# Patient Record
Sex: Female | Born: 1970
Health system: Southern US, Community
[De-identification: ages and names within clinical notes are randomized; demographics above are authoritative.]

## PROBLEM LIST (undated history)

## (undated) ENCOUNTER — Ambulatory Visit

## (undated) DIAGNOSIS — I10 Essential (primary) hypertension: Secondary | ICD-10-CM

## (undated) DIAGNOSIS — L509 Urticaria, unspecified: Secondary | ICD-10-CM

## (undated) DIAGNOSIS — H4312 Vitreous hemorrhage, left eye: Secondary | ICD-10-CM

## (undated) DIAGNOSIS — R51 Headache: Secondary | ICD-10-CM

## (undated) HISTORY — DX: Urticaria, unspecified: L50.9

## (undated) HISTORY — PX: WISDOM TOOTH EXTRACTION: SHX21

---

## 1999-04-30 ENCOUNTER — Emergency Department (HOSPITAL_COMMUNITY): Admission: EM | Admit: 1999-04-30 | Discharge: 1999-04-30 | Payer: Self-pay | Admitting: Internal Medicine

## 2001-06-27 ENCOUNTER — Other Ambulatory Visit: Admission: RE | Admit: 2001-06-27 | Discharge: 2001-06-27 | Payer: Self-pay | Admitting: Obstetrics and Gynecology

## 2008-03-30 ENCOUNTER — Emergency Department (HOSPITAL_COMMUNITY): Admission: EM | Admit: 2008-03-30 | Discharge: 2008-03-31 | Payer: Self-pay | Admitting: Emergency Medicine

## 2010-04-18 ENCOUNTER — Ambulatory Visit: Payer: Self-pay | Admitting: Internal Medicine

## 2010-04-18 ENCOUNTER — Encounter (INDEPENDENT_AMBULATORY_CARE_PROVIDER_SITE_OTHER): Payer: Self-pay | Admitting: Family Medicine

## 2010-04-18 LAB — CONVERTED CEMR LAB
ALT: 9 units/L (ref 0–35)
AST: 18 units/L (ref 0–37)
Albumin: 4.6 g/dL (ref 3.5–5.2)
BUN: 20 mg/dL (ref 6–23)
Basophils Relative: 0 % (ref 0–1)
CO2: 25 meq/L (ref 19–32)
Calcium: 9.7 mg/dL (ref 8.4–10.5)
Chloride: 104 meq/L (ref 96–112)
HDL: 78 mg/dL (ref >39)
Lymphocytes Relative: 24 % (ref 12–46)
MCHC: 31.7 g/dL (ref 30.0–36.0)
Monocytes Relative: 6 % (ref 3–12)
Neutro Abs: 3.2 10*3/uL (ref 1.7–7.7)
Neutrophils Relative %: 68 % (ref 43–77)
Potassium: 3.7 meq/L (ref 3.5–5.3)
RBC: 4.69 M/uL (ref 3.87–5.11)
WBC: 4.7 10*3/uL (ref 4.0–10.5)

## 2010-04-25 ENCOUNTER — Ambulatory Visit (HOSPITAL_COMMUNITY): Admission: RE | Admit: 2010-04-25 | Discharge: 2010-04-25 | Payer: Self-pay | Admitting: Family Medicine

## 2011-07-02 ENCOUNTER — Other Ambulatory Visit (HOSPITAL_COMMUNITY)
Admission: RE | Admit: 2011-07-02 | Discharge: 2011-07-02 | Disposition: A | Discharge status: Home / Self Care | Payer: BC Managed Care – PPO | Source: Ambulatory Visit | Attending: Obstetrics and Gynecology | Admitting: Obstetrics and Gynecology

## 2011-07-02 ENCOUNTER — Other Ambulatory Visit: Payer: Self-pay | Admitting: Obstetrics and Gynecology

## 2011-07-02 DIAGNOSIS — Z01419 Encounter for gynecological examination (general) (routine) without abnormal findings: Secondary | ICD-10-CM | POA: Insufficient documentation

## 2011-07-02 DIAGNOSIS — N760 Acute vaginitis: Secondary | ICD-10-CM | POA: Insufficient documentation

## 2011-07-02 DIAGNOSIS — Z113 Encounter for screening for infections with a predominantly sexual mode of transmission: Secondary | ICD-10-CM | POA: Insufficient documentation

## 2013-12-08 ENCOUNTER — Encounter (INDEPENDENT_AMBULATORY_CARE_PROVIDER_SITE_OTHER): Payer: Federal, State, Local not specified - PPO | Admitting: Ophthalmology

## 2013-12-08 DIAGNOSIS — H4310 Vitreous hemorrhage, unspecified eye: Secondary | ICD-10-CM

## 2013-12-08 DIAGNOSIS — H43819 Vitreous degeneration, unspecified eye: Secondary | ICD-10-CM

## 2013-12-13 ENCOUNTER — Encounter (INDEPENDENT_AMBULATORY_CARE_PROVIDER_SITE_OTHER): Payer: Federal, State, Local not specified - PPO | Admitting: Ophthalmology

## 2013-12-13 DIAGNOSIS — H43819 Vitreous degeneration, unspecified eye: Secondary | ICD-10-CM

## 2013-12-13 DIAGNOSIS — H2510 Age-related nuclear cataract, unspecified eye: Secondary | ICD-10-CM

## 2013-12-13 DIAGNOSIS — H4310 Vitreous hemorrhage, unspecified eye: Secondary | ICD-10-CM

## 2013-12-14 ENCOUNTER — Encounter (HOSPITAL_COMMUNITY): Payer: Self-pay | Admitting: Pharmacy Technician

## 2013-12-14 DIAGNOSIS — H4310 Vitreous hemorrhage, unspecified eye: Secondary | ICD-10-CM | POA: Diagnosis present

## 2013-12-14 NOTE — H&P (Signed)
Kristen Rosario is an 43 y.o. female.   Chief Complaint:loss of vision left eye after vomiting HPI: Vitreous hemorrhage left eye after violent vomiting   No past medical history on file.  No past surgical history on file.  No family history on file. Social History:  has no tobacco, alcohol, and drug history on file.  Allergies:  Allergies  Allergen Reactions  . Sulfur Itching    No prescriptions prior to admission    Review of systems otherwise negative  There were no vitals taken for this visit.  Physical exam: Mental status: oriented x3. Eyes: See eye exam associated with this date of surgery in media tab.  Scanned in by scanning center Ears, Nose, Throat: within normal limits Neck: Within Normal limits General: within normal limits Chest: Within normal limits Breast: deferred Heart: Within normal limits Abdomen: Within normal limits GU: deferred Extremities: within normal limits Skin: within normal limits  Assessment/Plan Vitreous hemorrhage  [facility] Pars plana vitrectomy, laser treatment, possible gas injection, membrane peel  Left eye  Sherrie GeorgeJohn D Raymonde Hamblin 12/14/2013, 12:44 PM

## 2013-12-18 ENCOUNTER — Encounter (HOSPITAL_COMMUNITY): Payer: Self-pay | Admitting: *Deleted

## 2013-12-18 MED ORDER — PHENYLEPHRINE HCL 2.5 % OP SOLN
1.0000 [drp] | OPHTHALMIC | Status: DC | PRN
  Filled 2013-12-18: qty 15

## 2013-12-18 MED ORDER — GATIFLOXACIN 0.5 % OP SOLN
1.0000 [drp] | OPHTHALMIC | Status: DC | PRN
  Filled 2013-12-18: qty 2.5

## 2013-12-18 MED ORDER — TROPICAMIDE 1 % OP SOLN
1.0000 [drp] | OPHTHALMIC | Status: DC | PRN
  Filled 2013-12-18: qty 3

## 2013-12-18 MED ORDER — CYCLOPENTOLATE HCL 1 % OP SOLN
1.0000 [drp] | OPHTHALMIC | Status: DC | PRN
  Filled 2013-12-18: qty 2

## 2013-12-19 ENCOUNTER — Ambulatory Visit (HOSPITAL_COMMUNITY): Payer: Federal, State, Local not specified - PPO | Admitting: Anesthesiology

## 2013-12-19 ENCOUNTER — Encounter (HOSPITAL_COMMUNITY): Payer: Self-pay | Admitting: *Deleted

## 2013-12-19 ENCOUNTER — Ambulatory Visit (HOSPITAL_COMMUNITY): Payer: Federal, State, Local not specified - PPO

## 2013-12-19 ENCOUNTER — Ambulatory Visit (HOSPITAL_COMMUNITY)
Admission: RE | Admit: 2013-12-19 | Discharge: 2013-12-20 | Disposition: A | Discharge status: Home / Self Care | Payer: Federal, State, Local not specified - PPO | Source: Ambulatory Visit | Attending: Ophthalmology | Admitting: Ophthalmology

## 2013-12-19 ENCOUNTER — Encounter (HOSPITAL_COMMUNITY): Payer: Federal, State, Local not specified - PPO | Admitting: Anesthesiology

## 2013-12-19 ENCOUNTER — Encounter (HOSPITAL_COMMUNITY)
Admission: RE | Disposition: A | Discharge status: Home / Self Care | Payer: Self-pay | Source: Ambulatory Visit | Attending: Ophthalmology

## 2013-12-19 DIAGNOSIS — H4310 Vitreous hemorrhage, unspecified eye: Secondary | ICD-10-CM

## 2013-12-19 DIAGNOSIS — H43319 Vitreous membranes and strands, unspecified eye: Secondary | ICD-10-CM | POA: Insufficient documentation

## 2013-12-19 DIAGNOSIS — H4312 Vitreous hemorrhage, left eye: Secondary | ICD-10-CM

## 2013-12-19 HISTORY — PX: PARS PLANA VITRECTOMY: SHX2166

## 2013-12-19 HISTORY — DX: Essential (primary) hypertension: I10

## 2013-12-19 HISTORY — DX: Vitreous hemorrhage, left eye: H43.12

## 2013-12-19 HISTORY — PX: MEMBRANE PEEL: SHX5967

## 2013-12-19 HISTORY — DX: Headache: R51

## 2013-12-19 LAB — CBC
HCT: 36.4 % (ref 36.0–46.0)
HEMOGLOBIN: 11.7 g/dL — AB (ref 12.0–15.0)
MCH: 24.4 pg — AB (ref 26.0–34.0)
MCHC: 32.1 g/dL (ref 30.0–36.0)
MCV: 76 fL — ABNORMAL LOW (ref 78.0–100.0)
PLATELETS: 254 10*3/uL (ref 150–400)
RBC: 4.79 MIL/uL (ref 3.87–5.11)
RDW: 15.6 % — AB (ref 11.5–15.5)
WBC: 3.9 10*3/uL — ABNORMAL LOW (ref 4.0–10.5)

## 2013-12-19 LAB — HCG, SERUM, QUALITATIVE: PREG SERUM: NEGATIVE

## 2013-12-19 SURGERY — PARS PLANA VITRECTOMY WITH 25 GAUGE
Anesthesia: General | Site: Eye | Laterality: Left

## 2013-12-19 MED ORDER — LATANOPROST 0.005 % OP SOLN
1.0000 [drp] | Freq: Every day | OPHTHALMIC | Status: DC
  Filled 2013-12-19: qty 2.5

## 2013-12-19 MED ORDER — HEMOSTATIC AGENTS (NO CHARGE) OPTIME
TOPICAL | Status: DC | PRN
  Administered 2013-12-19: 1 via TOPICAL

## 2013-12-19 MED ORDER — STERILE WATER FOR IRRIGATION IR SOLN
Status: DC | PRN
  Administered 2013-12-19: 200 mL

## 2013-12-19 MED ORDER — GLYCOPYRROLATE 0.2 MG/ML IJ SOLN
INTRAMUSCULAR | Status: DC | PRN
  Administered 2013-12-19: 0.4 mg via INTRAVENOUS

## 2013-12-19 MED ORDER — DEXAMETHASONE SODIUM PHOSPHATE 10 MG/ML IJ SOLN
INTRAMUSCULAR | Status: DC | PRN
  Administered 2013-12-19: 10 mg via INTRAVENOUS

## 2013-12-19 MED ORDER — BACITRACIN-POLYMYXIN B 500-10000 UNIT/GM OP OINT
TOPICAL_OINTMENT | OPHTHALMIC | Status: DC | PRN
  Administered 2013-12-19: 1 via OPHTHALMIC

## 2013-12-19 MED ORDER — LIDOCAINE HCL (CARDIAC) 20 MG/ML IV SOLN
INTRAVENOUS | Status: AC
  Filled 2013-12-19: qty 10

## 2013-12-19 MED ORDER — 0.9 % SODIUM CHLORIDE (POUR BTL) OPTIME
TOPICAL | Status: DC | PRN
  Administered 2013-12-19: 200 mL

## 2013-12-19 MED ORDER — FENTANYL CITRATE 0.05 MG/ML IJ SOLN
25.0000 ug | INTRAMUSCULAR | Status: DC | PRN
  Administered 2013-12-19 (×2): 50 ug via INTRAVENOUS

## 2013-12-19 MED ORDER — CEFAZOLIN SODIUM-DEXTROSE 2-3 GM-% IV SOLR
INTRAVENOUS | Status: DC | PRN
  Administered 2013-12-19: 2 g via INTRAVENOUS

## 2013-12-19 MED ORDER — SODIUM CHLORIDE 0.9 % IV SOLN
INTRAVENOUS | Status: DC
  Administered 2013-12-19: 10:00:00 via INTRAVENOUS

## 2013-12-19 MED ORDER — MIDAZOLAM HCL 2 MG/2ML IJ SOLN
INTRAMUSCULAR | Status: AC
  Filled 2013-12-19: qty 2

## 2013-12-19 MED ORDER — BSS PLUS IO SOLN
INTRAOCULAR | Status: AC
Start: 2013-12-19 — End: 2013-12-19
  Filled 2013-12-19: qty 500

## 2013-12-19 MED ORDER — SODIUM CHLORIDE 0.9 % IJ SOLN
INTRAMUSCULAR | Status: AC
  Filled 2013-12-19: qty 10

## 2013-12-19 MED ORDER — TEMAZEPAM 15 MG PO CAPS: 15.0000 mg | ORAL_CAPSULE | Freq: Every evening | ORAL | Status: DC | PRN

## 2013-12-19 MED ORDER — CYCLOPENTOLATE HCL 1 % OP SOLN
1.0000 [drp] | OPHTHALMIC | Status: AC
Start: 2013-12-19 — End: 2013-12-19
  Administered 2013-12-19 (×3): 1 [drp] via OPHTHALMIC
  Filled 2013-12-19: qty 2

## 2013-12-19 MED ORDER — DEXAMETHASONE SODIUM PHOSPHATE 10 MG/ML IJ SOLN
INTRAMUSCULAR | Status: AC
  Filled 2013-12-19: qty 1

## 2013-12-19 MED ORDER — ONDANSETRON HCL 4 MG/2ML IJ SOLN: 4.0000 mg | Freq: Four times a day (QID) | INTRAMUSCULAR | Status: DC | PRN

## 2013-12-19 MED ORDER — BSS PLUS IO SOLN
INTRAOCULAR | Status: DC | PRN
  Administered 2013-12-19: 13:00:00

## 2013-12-19 MED ORDER — PROPOFOL 10 MG/ML IV BOLUS
INTRAVENOUS | Status: DC | PRN
  Administered 2013-12-19: 150 mg via INTRAVENOUS

## 2013-12-19 MED ORDER — HYDROCODONE-ACETAMINOPHEN 5-325 MG PO TABS
1.0000 | ORAL_TABLET | ORAL | Status: DC | PRN
Start: 2013-12-19 — End: 2013-12-20
  Administered 2013-12-19 – 2013-12-20 (×2): 2 via ORAL
  Filled 2013-12-19 (×2): qty 2

## 2013-12-19 MED ORDER — ONDANSETRON HCL 4 MG/2ML IJ SOLN
INTRAMUSCULAR | Status: DC | PRN
  Administered 2013-12-19: 4 mg via INTRAVENOUS

## 2013-12-19 MED ORDER — OXYCODONE HCL 5 MG PO TABS: 5.0000 mg | ORAL_TABLET | Freq: Once | ORAL | Status: DC | PRN

## 2013-12-19 MED ORDER — METOCLOPRAMIDE HCL 5 MG/ML IJ SOLN: 10.0000 mg | Freq: Once | INTRAMUSCULAR | Status: DC | PRN

## 2013-12-19 MED ORDER — BACITRACIN-POLYMYXIN B 500-10000 UNIT/GM OP OINT
TOPICAL_OINTMENT | OPHTHALMIC | Status: AC
  Filled 2013-12-19: qty 3.5

## 2013-12-19 MED ORDER — SODIUM CHLORIDE 0.45 % IV SOLN
INTRAVENOUS | Status: DC
  Administered 2013-12-19: 16:00:00 via INTRAVENOUS

## 2013-12-19 MED ORDER — FENTANYL CITRATE 0.05 MG/ML IJ SOLN
INTRAMUSCULAR | Status: AC
  Filled 2013-12-19: qty 5

## 2013-12-19 MED ORDER — PREDNISOLONE ACETATE 1 % OP SUSP
1.0000 [drp] | Freq: Four times a day (QID) | OPHTHALMIC | Status: DC
  Filled 2013-12-19: qty 5
  Filled 2013-12-19: qty 1

## 2013-12-19 MED ORDER — CEFAZOLIN SODIUM-DEXTROSE 2-3 GM-% IV SOLR
INTRAVENOUS | Status: AC
  Filled 2013-12-19: qty 50

## 2013-12-19 MED ORDER — PHENYLEPHRINE HCL 2.5 % OP SOLN
1.0000 [drp] | OPHTHALMIC | Status: AC
  Administered 2013-12-19 (×3): 1 [drp] via OPHTHALMIC
  Filled 2013-12-19: qty 2

## 2013-12-19 MED ORDER — GATIFLOXACIN 0.5 % OP SOLN
1.0000 [drp] | Freq: Four times a day (QID) | OPHTHALMIC | Status: DC
  Filled 2013-12-19: qty 2.5

## 2013-12-19 MED ORDER — LIDOCAINE HCL 4 % MT SOLN
OROMUCOSAL | Status: DC | PRN
  Administered 2013-12-19: 4 mL via TOPICAL

## 2013-12-19 MED ORDER — LIDOCAINE HCL (CARDIAC) 20 MG/ML IV SOLN
INTRAVENOUS | Status: DC | PRN
Start: 2013-12-19 — End: 2013-12-19
  Administered 2013-12-19: 40 mg via INTRAVENOUS

## 2013-12-19 MED ORDER — TROPICAMIDE 1 % OP SOLN
1.0000 [drp] | OPHTHALMIC | Status: AC
  Administered 2013-12-19 (×3): 1 [drp] via OPHTHALMIC
  Filled 2013-12-19: qty 2

## 2013-12-19 MED ORDER — FENTANYL CITRATE 0.05 MG/ML IJ SOLN
INTRAMUSCULAR | Status: AC
  Filled 2013-12-19: qty 2

## 2013-12-19 MED ORDER — SODIUM HYALURONATE 10 MG/ML IO SOLN
INTRAOCULAR | Status: AC
  Filled 2013-12-19: qty 0.85

## 2013-12-19 MED ORDER — BRIMONIDINE TARTRATE 0.2 % OP SOLN
1.0000 [drp] | Freq: Two times a day (BID) | OPHTHALMIC | Status: DC
  Filled 2013-12-19: qty 5

## 2013-12-19 MED ORDER — OXYCODONE HCL 5 MG/5ML PO SOLN: 5.0000 mg | Freq: Once | ORAL | Status: DC | PRN

## 2013-12-19 MED ORDER — ROCURONIUM BROMIDE 100 MG/10ML IV SOLN
INTRAVENOUS | Status: DC | PRN
  Administered 2013-12-19: 35 mg via INTRAVENOUS

## 2013-12-19 MED ORDER — MORPHINE SULFATE 2 MG/ML IJ SOLN
1.0000 mg | INTRAMUSCULAR | Status: AC | PRN
  Administered 2013-12-19 (×2): 2 mg via INTRAVENOUS
  Filled 2013-12-19 (×2): qty 1

## 2013-12-19 MED ORDER — BACITRACIN-POLYMYXIN B 500-10000 UNIT/GM OP OINT
1.0000 "application " | TOPICAL_OINTMENT | Freq: Four times a day (QID) | OPHTHALMIC | Status: DC
  Filled 2013-12-19: qty 3.5

## 2013-12-19 MED ORDER — NEOSTIGMINE METHYLSULFATE 1 MG/ML IJ SOLN
INTRAMUSCULAR | Status: DC | PRN
  Administered 2013-12-19: 3 mg via INTRAVENOUS

## 2013-12-19 MED ORDER — PROPOFOL 10 MG/ML IV BOLUS
INTRAVENOUS | Status: AC
  Filled 2013-12-19: qty 20

## 2013-12-19 MED ORDER — GENTAMICIN SULFATE 40 MG/ML IJ SOLN
INTRAMUSCULAR | Status: AC
  Filled 2013-12-19: qty 2

## 2013-12-19 MED ORDER — ATROPINE SULFATE 1 % OP SOLN
OPHTHALMIC | Status: AC
  Filled 2013-12-19: qty 2

## 2013-12-19 MED ORDER — SODIUM HYALURONATE 10 MG/ML IO SOLN
INTRAOCULAR | Status: DC | PRN
  Administered 2013-12-19: 0.85 mL via INTRAOCULAR

## 2013-12-19 MED ORDER — POLYMYXIN B SULFATE 500000 UNITS IJ SOLR
INTRAMUSCULAR | Status: AC
  Filled 2013-12-19: qty 1

## 2013-12-19 MED ORDER — ROCURONIUM BROMIDE 50 MG/5ML IV SOLN
INTRAVENOUS | Status: AC
  Filled 2013-12-19: qty 1

## 2013-12-19 MED ORDER — SODIUM CHLORIDE 0.9 % IJ SOLN
INTRAMUSCULAR | Status: DC | PRN
  Administered 2013-12-19: 14:00:00

## 2013-12-19 MED ORDER — DOCUSATE SODIUM 100 MG PO CAPS
100.0000 mg | ORAL_CAPSULE | Freq: Two times a day (BID) | ORAL | Status: DC
  Administered 2013-12-19: 100 mg via ORAL
  Filled 2013-12-19: qty 1

## 2013-12-19 MED ORDER — ACETAZOLAMIDE SODIUM 500 MG IJ SOLR
500.0000 mg | Freq: Once | INTRAMUSCULAR | Status: AC
  Administered 2013-12-20: 500 mg via INTRAVENOUS
  Filled 2013-12-19: qty 500

## 2013-12-19 MED ORDER — MAGNESIUM HYDROXIDE 400 MG/5ML PO SUSP: 15.0000 mL | Freq: Four times a day (QID) | ORAL | Status: DC | PRN

## 2013-12-19 MED ORDER — DEXAMETHASONE SODIUM PHOSPHATE 4 MG/ML IJ SOLN
INTRAMUSCULAR | Status: DC | PRN
  Administered 2013-12-19: 8 mg via INTRAVENOUS

## 2013-12-19 MED ORDER — EPINEPHRINE HCL 1 MG/ML IJ SOLN
INTRAMUSCULAR | Status: AC
  Filled 2013-12-19: qty 1

## 2013-12-19 MED ORDER — FENTANYL CITRATE 0.05 MG/ML IJ SOLN
INTRAMUSCULAR | Status: DC | PRN
  Administered 2013-12-19 (×3): 50 ug via INTRAVENOUS

## 2013-12-19 MED ORDER — BUPIVACAINE HCL (PF) 0.75 % IJ SOLN
INTRAMUSCULAR | Status: DC | PRN
  Administered 2013-12-19: 10 mL

## 2013-12-19 MED ORDER — MIDAZOLAM HCL 5 MG/5ML IJ SOLN
INTRAMUSCULAR | Status: DC | PRN
  Administered 2013-12-19: 2 mg via INTRAVENOUS

## 2013-12-19 MED ORDER — ONDANSETRON HCL 4 MG/2ML IJ SOLN
INTRAMUSCULAR | Status: AC
  Filled 2013-12-19: qty 2

## 2013-12-19 MED ORDER — TETRACAINE HCL 0.5 % OP SOLN
2.0000 [drp] | Freq: Once | OPHTHALMIC | Status: DC
  Filled 2013-12-19: qty 2

## 2013-12-19 MED ORDER — GATIFLOXACIN 0.5 % OP SOLN
1.0000 [drp] | OPHTHALMIC | Status: AC
  Administered 2013-12-19 (×3): 1 [drp] via OPHTHALMIC
  Filled 2013-12-19: qty 2.5

## 2013-12-19 MED ORDER — BUPIVACAINE HCL (PF) 0.75 % IJ SOLN
INTRAMUSCULAR | Status: AC
  Filled 2013-12-19: qty 10

## 2013-12-19 MED ORDER — ACETAMINOPHEN 325 MG PO TABS: 325.0000 mg | ORAL_TABLET | ORAL | Status: DC | PRN

## 2013-12-19 SURGICAL SUPPLY — 63 items
APPLICATOR DR MATTHEWS STRL (MISCELLANEOUS) ×3 IMPLANT
BLADE 10 SAFETY STRL DISP (BLADE) IMPLANT
BLADE EYE CATARACT 19 1.4 BEAV (BLADE) IMPLANT
BLADE MVR KNIFE 19G (BLADE) IMPLANT
BLADE MVR KNIFE 20G (BLADE) IMPLANT
CANNULA DUAL BORE 23G (CANNULA) IMPLANT
CANNULA VLV SOFT TIP 25GA (OPHTHALMIC) ×3 IMPLANT
CORDS BIPOLAR (ELECTRODE) IMPLANT
COTTONBALL LRG STERILE PKG (GAUZE/BANDAGES/DRESSINGS) ×9 IMPLANT
COVER MAYO STAND STRL (DRAPES) ×3 IMPLANT
DRAPE INCISE 51X51 W/FILM STRL (DRAPES) ×3 IMPLANT
DRAPE OPHTHALMIC 77X100 STRL (CUSTOM PROCEDURE TRAY) ×3 IMPLANT
FILTER BLUE MILLIPORE (MISCELLANEOUS) IMPLANT
FILTER STRAW FLUID ASPIR (MISCELLANEOUS) ×3 IMPLANT
FORCEPS ECKARDT ILM 25G SERR (OPHTHALMIC RELATED) IMPLANT
FORCEPS GRIESHABER ILM 25G A (INSTRUMENTS) IMPLANT
GLOVE SS BIOGEL STRL SZ 6.5 (GLOVE) ×1 IMPLANT
GLOVE SS BIOGEL STRL SZ 7 (GLOVE) ×1 IMPLANT
GLOVE SUPERSENSE BIOGEL SZ 6.5 (GLOVE) ×2
GLOVE SUPERSENSE BIOGEL SZ 7 (GLOVE) ×2
GLOVE SURG 8.5 LATEX PF (GLOVE) ×3 IMPLANT
GLOVE SURG SS PI 6.5 STRL IVOR (GLOVE) ×3 IMPLANT
GOWN STRL REUS W/ TWL LRG LVL3 (GOWN DISPOSABLE) ×3 IMPLANT
GOWN STRL REUS W/TWL LRG LVL3 (GOWN DISPOSABLE) ×6
HANDLE PNEUMATIC FOR CONSTEL (OPHTHALMIC) IMPLANT
KIT BASIN OR (CUSTOM PROCEDURE TRAY) ×3 IMPLANT
KNIFE CRESCENT 2.5 55 ANG (BLADE) IMPLANT
MICROPICK 25G (MISCELLANEOUS)
NEEDLE 18GX1X1/2 (RX/OR ONLY) (NEEDLE) ×3 IMPLANT
NEEDLE 25GX 5/8IN NON SAFETY (NEEDLE) ×3 IMPLANT
NEEDLE 27GAX1X1/2 (NEEDLE) IMPLANT
NEEDLE FILTER BLUNT 18X 1/2SAF (NEEDLE) ×2
NEEDLE FILTER BLUNT 18X1 1/2 (NEEDLE) ×1 IMPLANT
NEEDLE HYPO 30X.5 LL (NEEDLE) ×3 IMPLANT
NS IRRIG 1000ML POUR BTL (IV SOLUTION) ×3 IMPLANT
PACK VITRECTOMY CUSTOM (CUSTOM PROCEDURE TRAY) ×3 IMPLANT
PAD ARMBOARD 7.5X6 YLW CONV (MISCELLANEOUS) ×6 IMPLANT
PAK PIK VITRECTOMY CVS 25GA (OPHTHALMIC) ×3 IMPLANT
PENCIL BIPOLAR 25GA STR DISP (OPHTHALMIC RELATED) IMPLANT
PIC ILLUMINATED 25G (OPHTHALMIC) ×3
PICK MICROPICK 25G (MISCELLANEOUS) IMPLANT
PIK ILLUMINATED 25G (OPHTHALMIC) ×1 IMPLANT
PROBE LASER ILLUM FLEX CVD 25G (OPHTHALMIC) IMPLANT
REPL STRA BRUSH NEEDLE (NEEDLE) IMPLANT
RESERVOIR BACK FLUSH (MISCELLANEOUS) IMPLANT
ROLLS DENTAL (MISCELLANEOUS) ×6 IMPLANT
SCISSORS TIP ADVANCED DSP 25GA (INSTRUMENTS) IMPLANT
SCRAPER DIAMOND DUST MEMBRANE (MISCELLANEOUS) IMPLANT
SPONGE SURGIFOAM ABS GEL 12-7 (HEMOSTASIS) ×3 IMPLANT
STOPCOCK 4 WAY LG BORE MALE ST (IV SETS) IMPLANT
SUT CHROMIC 7 0 TG140 8 (SUTURE) IMPLANT
SUT ETHILON 10 0 CS140 6 (SUTURE) IMPLANT
SUT ETHILON 9 0 TG140 8 (SUTURE) IMPLANT
SUT POLY NON ABSORB 10-0 8 STR (SUTURE) IMPLANT
SUT SILK 4 0 RB 1 (SUTURE) IMPLANT
SYR 20CC LL (SYRINGE) ×3 IMPLANT
SYR 5ML LL (SYRINGE) IMPLANT
SYR BULB 3OZ (MISCELLANEOUS) ×3 IMPLANT
SYR TB 1ML LUER SLIP (SYRINGE) ×3 IMPLANT
SYRINGE 10CC LL (SYRINGE) IMPLANT
TOWEL OR 17X24 6PK STRL BLUE (TOWEL DISPOSABLE) ×9 IMPLANT
WATER STERILE IRR 1000ML POUR (IV SOLUTION) ×3 IMPLANT
WIPE INSTRUMENT VISIWIPE 73X73 (MISCELLANEOUS) ×3 IMPLANT

## 2013-12-19 NOTE — Anesthesia Postprocedure Evaluation (Signed)
Anesthesia Post Note  Patient: Kristen Rosario  Procedure(s) Performed: Procedure(s) (LRB): PARS PLANA VITRECTOMY WITH 25 GAUGE; ENDOLASER;FLUID/AIR EXCHANGE (Left) MEMBRANE PEEL (Left)  Anesthesia type: General  Patient location: PACU  Post pain: Pain level controlled  Post assessment: Patient's Cardiovascular Status Stable  Last Vitals:  Filed Vitals:   12/19/13 1552  BP:   Pulse: 60  Temp: 36.8 C  Resp: 16    Post vital signs: Reviewed and stable  Level of consciousness: alert  Complications: No apparent anesthesia complications

## 2013-12-19 NOTE — Transfer of Care (Signed)
Immediate Anesthesia Transfer of Care Note  Patient: Kristen Rosario  Procedure(s) Performed: Procedure(s): PARS PLANA VITRECTOMY WITH 25 GAUGE; ENDOLASER;FLUID/AIR EXCHANGE (Left) MEMBRANE PEEL (Left)  Patient Location: PACU  Anesthesia Type:General  Level of Consciousness: awake and alert   Airway & Oxygen Therapy: Patient Spontanous Breathing and Patient connected to nasal cannula oxygen  Post-op Assessment: Report given to PACU RN, Post -op Vital signs reviewed and stable and Patient moving all extremities X 4  Post vital signs: Reviewed and stable  Complications: No apparent anesthesia complications

## 2013-12-19 NOTE — H&P (Signed)
I examined the patient today and there is no change in the medical status 

## 2013-12-19 NOTE — Anesthesia Preprocedure Evaluation (Addendum)
Anesthesia Evaluation  Patient identified by MRN, date of birth, ID band Patient awake    Reviewed: Allergy & Precautions, H&P , NPO status , Patient's Chart, lab work & pertinent test results, reviewed documented beta blocker date and time   Airway Mallampati: II TM Distance: >3 FB Neck ROM: full    Dental  (+) Teeth Intact   Pulmonary neg pulmonary ROS,  breath sounds clear to auscultation        Cardiovascular negative cardio ROS  Rhythm:regular     Neuro/Psych negative neurological ROS  negative psych ROS   GI/Hepatic negative GI ROS, Neg liver ROS,   Endo/Other  negative endocrine ROS  Renal/GU negative Renal ROS  negative genitourinary   Musculoskeletal   Abdominal   Peds  Hematology negative hematology ROS (+)   Anesthesia Other Findings See surgeon's H&P   Reproductive/Obstetrics negative OB ROS                          Anesthesia Physical Anesthesia Plan  ASA: I  Anesthesia Plan: General   Post-op Pain Management:    Induction: Intravenous  Airway Management Planned: Oral ETT  Additional Equipment:   Intra-op Plan:   Post-operative Plan: Extubation in OR  Informed Consent: I have reviewed the patients History and Physical, chart, labs and discussed the procedure including the risks, benefits and alternatives for the proposed anesthesia with the patient or authorized representative who has indicated his/her understanding and acceptance.   Dental Advisory Given  Plan Discussed with: CRNA and Surgeon  Anesthesia Plan Comments:         Anesthesia Quick Evaluation

## 2013-12-19 NOTE — Anesthesia Procedure Notes (Signed)
Procedure Name: Intubation Date/Time: 12/19/2013 1:50 PM Performed by: Blair Heys E Pre-anesthesia Checklist: Patient identified, Emergency Drugs available, Suction available and Patient being monitored Patient Re-evaluated:Patient Re-evaluated prior to inductionOxygen Delivery Method: Circle system utilized Preoxygenation: Pre-oxygenation with 100% oxygen Ventilation: Mask ventilation without difficulty Laryngoscope Size: Miller and 2 Grade View: Grade I Tube type: Oral Tube size: 7.5 mm Number of attempts: 1 Airway Equipment and Method: Stylet and LTA kit utilized Placement Confirmation: ETT inserted through vocal cords under direct vision,  positive ETCO2 and breath sounds checked- equal and bilateral Secured at: 21 cm Tube secured with: Tape Dental Injury: Teeth and Oropharynx as per pre-operative assessment

## 2013-12-20 MED ORDER — GATIFLOXACIN 0.5 % OP SOLN: 1.0000 [drp] | Freq: Four times a day (QID) | OPHTHALMIC | Status: DC

## 2013-12-20 MED ORDER — PREDNISOLONE ACETATE 1 % OP SUSP: 1.0000 [drp] | Freq: Four times a day (QID) | OPHTHALMIC | Status: DC

## 2013-12-20 MED ORDER — HYDROCODONE-ACETAMINOPHEN 5-500 MG PO CAPS: 1.0000 | ORAL_CAPSULE | Freq: Four times a day (QID) | ORAL | Status: DC | PRN

## 2013-12-20 MED ORDER — BACITRACIN-POLYMYXIN B 500-10000 UNIT/GM OP OINT: 1.0000 "application " | TOPICAL_OINTMENT | Freq: Four times a day (QID) | OPHTHALMIC | Status: DC

## 2013-12-20 NOTE — Op Note (Signed)
[name], Kristen Rosario               ACCOUNT NO.:  [id number]  MEDICAL RECORD NO.:  [id number]  LOCATION:  [id number]                        FACILITY:  [facility]  PHYSICIAN:  Beulah GandyJohn D. Ashley RoyaltyMatthews, M.D. DATE OF BIRTH:  15-Nov-1970  DATE OF PROCEDURE:  12/19/2013 DATE OF DISCHARGE:                              OPERATIVE REPORT   ADMISSION DIAGNOSIS:  Vitreous hemorrhage, posterior membranes in left eye.  PROCEDURE:  Pars plana vitrectomy, membrane peel, retinal photocoagulation in the left eye and gas fluid exchange in the left eye.  SURGEON:  Beverley FiedlerJohn Matthew, M.D.  ASSISTANT:  Rosalie DoctorLisa Johnson, SA.  ANESTHESIA:  General.  DETAILS:  Usual prep and drape, 25-gauge trocars placed at 10 o'clock, 2 o'clock, and 4 o'clock with infusion at 4 o'clock.  Provisc placed on the corneal surface.  The BIOM viewing system moved into place.  Pars plana vitrectomy was begun just behind the crystalline lens.  Some red blood was seen on the posterior surface of the lens.  This was carefully teased and removed without touching the lens.  The vitrectomy was carried posteriorly where white membranes were encountered in this mid vitreous cavity.  The core vitrectomy was performed removing vitreous down to the retinal surface.  A posterior vitreous detachment was created with the lighted pick and the posterior hyaloid was peeled. Once this was lifted free from the retinal surface, it was removed with the vitreous cutter.  A large sheet of white blood was seen inferiorly. This area was incised with the vitreous cutter, peeled with the lighted pick and then removed with the vitreous cutter.  Down the inferior retina was viewed with scleral depression, and white with red clumps of blood was seen in the vitreous cavity of the inferior pars plana.  This was carefully removed under low suction and rapid cutting.  The instruments were switched from side-to-side to have the optimum safe access to the vitreous hemorrhage for  removal.  Once the entire vitreous base was trimmed, the retina was inspected and there were no openings seen.  The endolaser was positioned in the eye and 874 burns were placed around the inferior periphery where weak gray areas of the retina were seen.  The power was 400 mW 1000 microns in size and 0.05 seconds each. A 30% gas fluid exchange was then performed.  The instruments removed from the eye.  The 25-gauge trocars were removed from the eye.  The wounds were tested until they were found to be secure.  Closing pressure was 10 with a Barraquer tonometer.  Polymyxin and gentamicin were irrigated into tenon space. Marcaine was injected around the globe for postop pain.  Decadron 10 mg was injected into the lower subconjunctival space.  Polysporin ophthalmic ointment, a patch and shield were placed.  The patient was awakened and taken to recovery in satisfactory condition.     Beulah GandyJohn D. Ashley RoyaltyMatthews, M.D.     JDM/MEDQ  D:  12/19/2013  T:  12/20/2013  Job:  [id number]

## 2013-12-20 NOTE — Progress Notes (Signed)
12/20/2013, 6:25 AM  Mental Status:  Awake, Alert, Oriented  Anterior segment: Cornea  Clear    Anterior Chamber Clear    Lens:   Clear  Intra Ocular Pressure 10 mmHg with Tonopen  Vitreous: Clear 20%gas bubble   Retina:  Attached Good laser reaction   Impression: Excellent result Retina attached Poor view  Final Diagnosis: Principal Problem:   Vitreous hemorrhage Active Problems:   Vitreous hemorrhage of left eye   Plan: start post operative eye drops.  Discharge to home.  Give post operative instructions  Sherrie GeorgeJohn D Alton Bouknight 12/20/2013, 6:25 AM

## 2013-12-20 NOTE — Progress Notes (Signed)
Patient discharged to home with instructions. 

## 2013-12-20 NOTE — Discharge Summary (Signed)
Discharge summary not needed on [facility] patients per medical records. 

## 2013-12-21 ENCOUNTER — Encounter (HOSPITAL_COMMUNITY): Payer: Self-pay | Admitting: Ophthalmology

## 2013-12-26 ENCOUNTER — Inpatient Hospital Stay (INDEPENDENT_AMBULATORY_CARE_PROVIDER_SITE_OTHER): Payer: Federal, State, Local not specified - PPO | Admitting: Ophthalmology

## 2013-12-26 DIAGNOSIS — H3500 Unspecified background retinopathy: Secondary | ICD-10-CM

## 2014-01-17 ENCOUNTER — Encounter (INDEPENDENT_AMBULATORY_CARE_PROVIDER_SITE_OTHER): Payer: Federal, State, Local not specified - PPO | Admitting: Ophthalmology

## 2014-01-17 DIAGNOSIS — H4310 Vitreous hemorrhage, unspecified eye: Secondary | ICD-10-CM

## 2014-04-04 ENCOUNTER — Encounter (INDEPENDENT_AMBULATORY_CARE_PROVIDER_SITE_OTHER): Payer: Federal, State, Local not specified - PPO | Admitting: Ophthalmology

## 2015-03-15 IMAGING — CR DG CHEST 2V
2 series · 2 of 2 positions shown · non-contrast
Comparison: None.

CLINICAL DATA: Preop surgery.

EXAM:
CHEST  2 VIEW

[w chest pa]
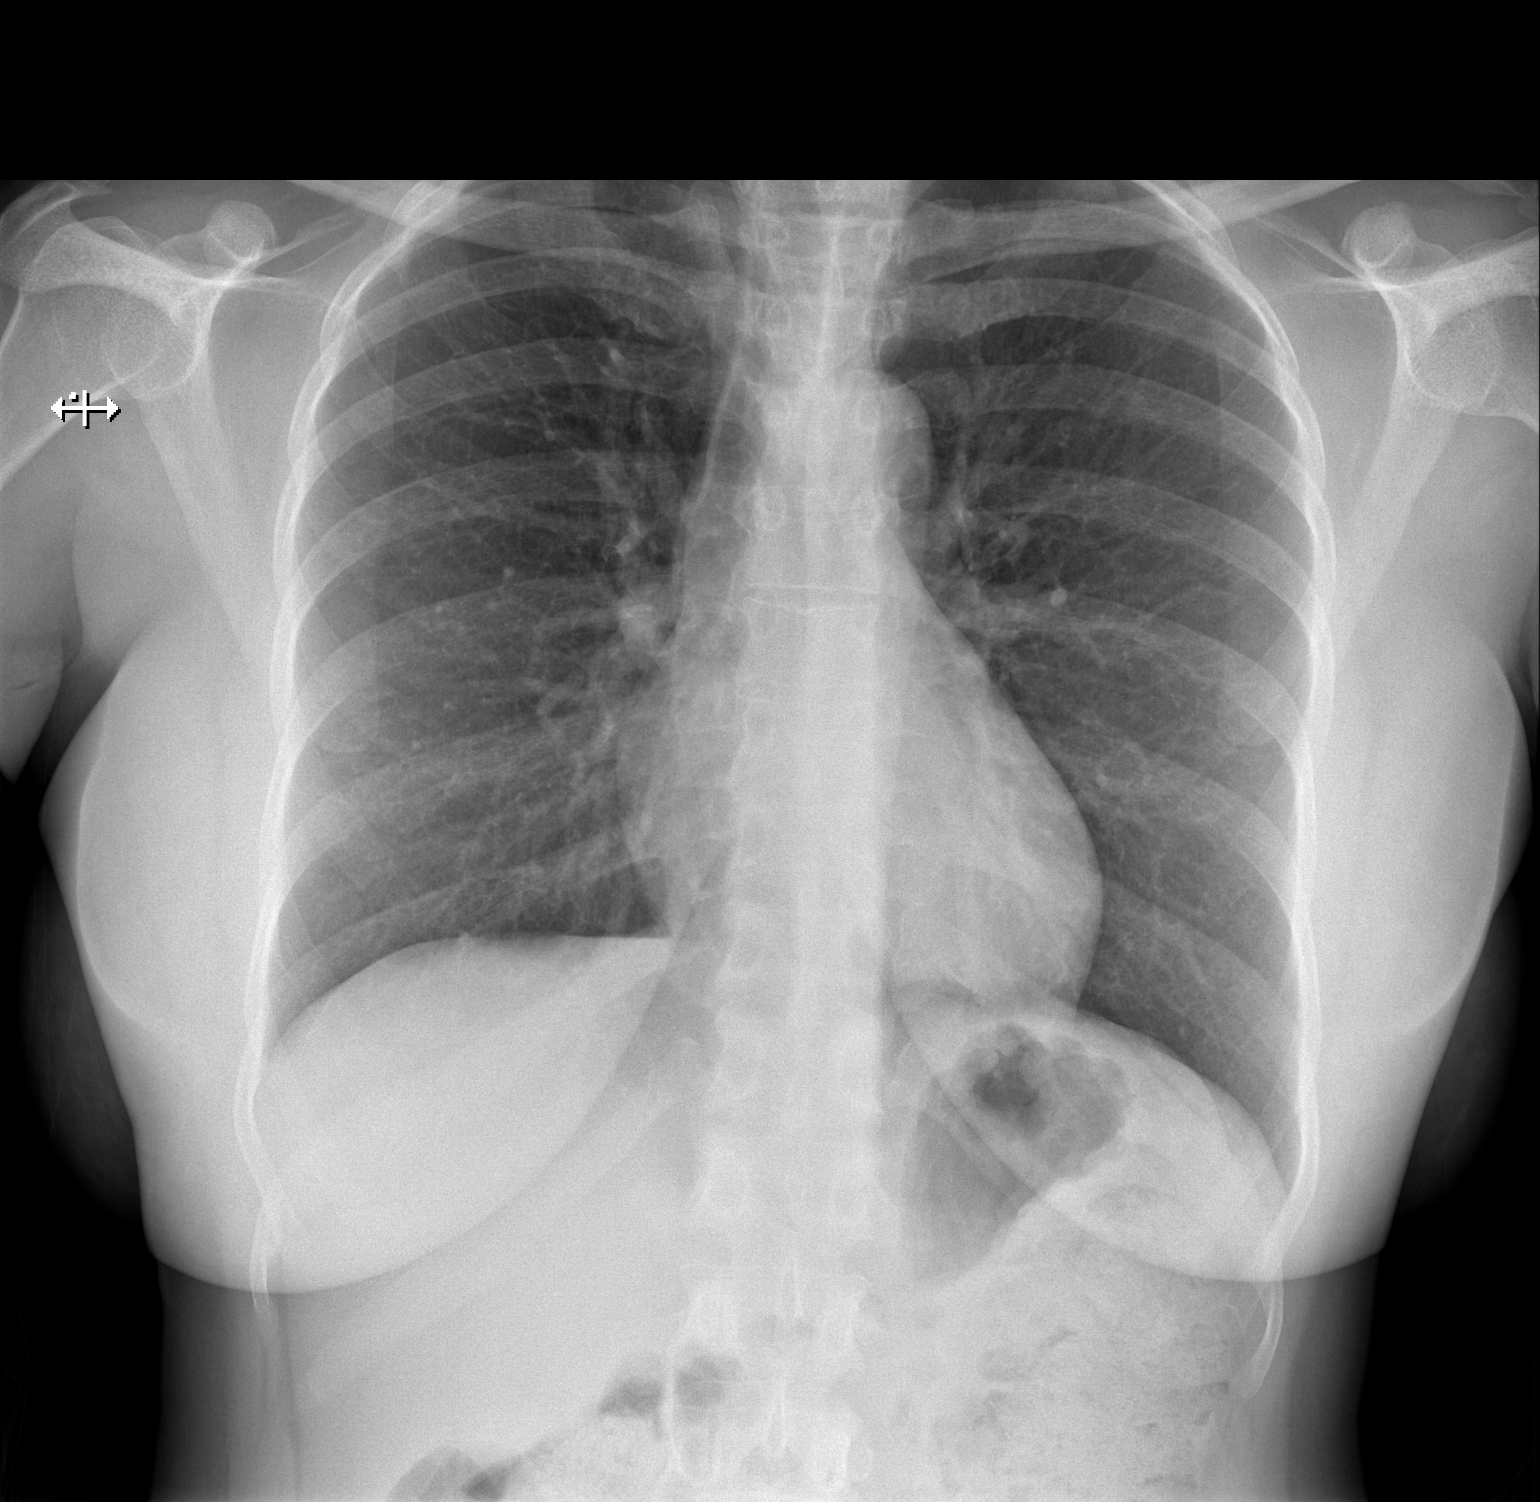

[w chest lat]
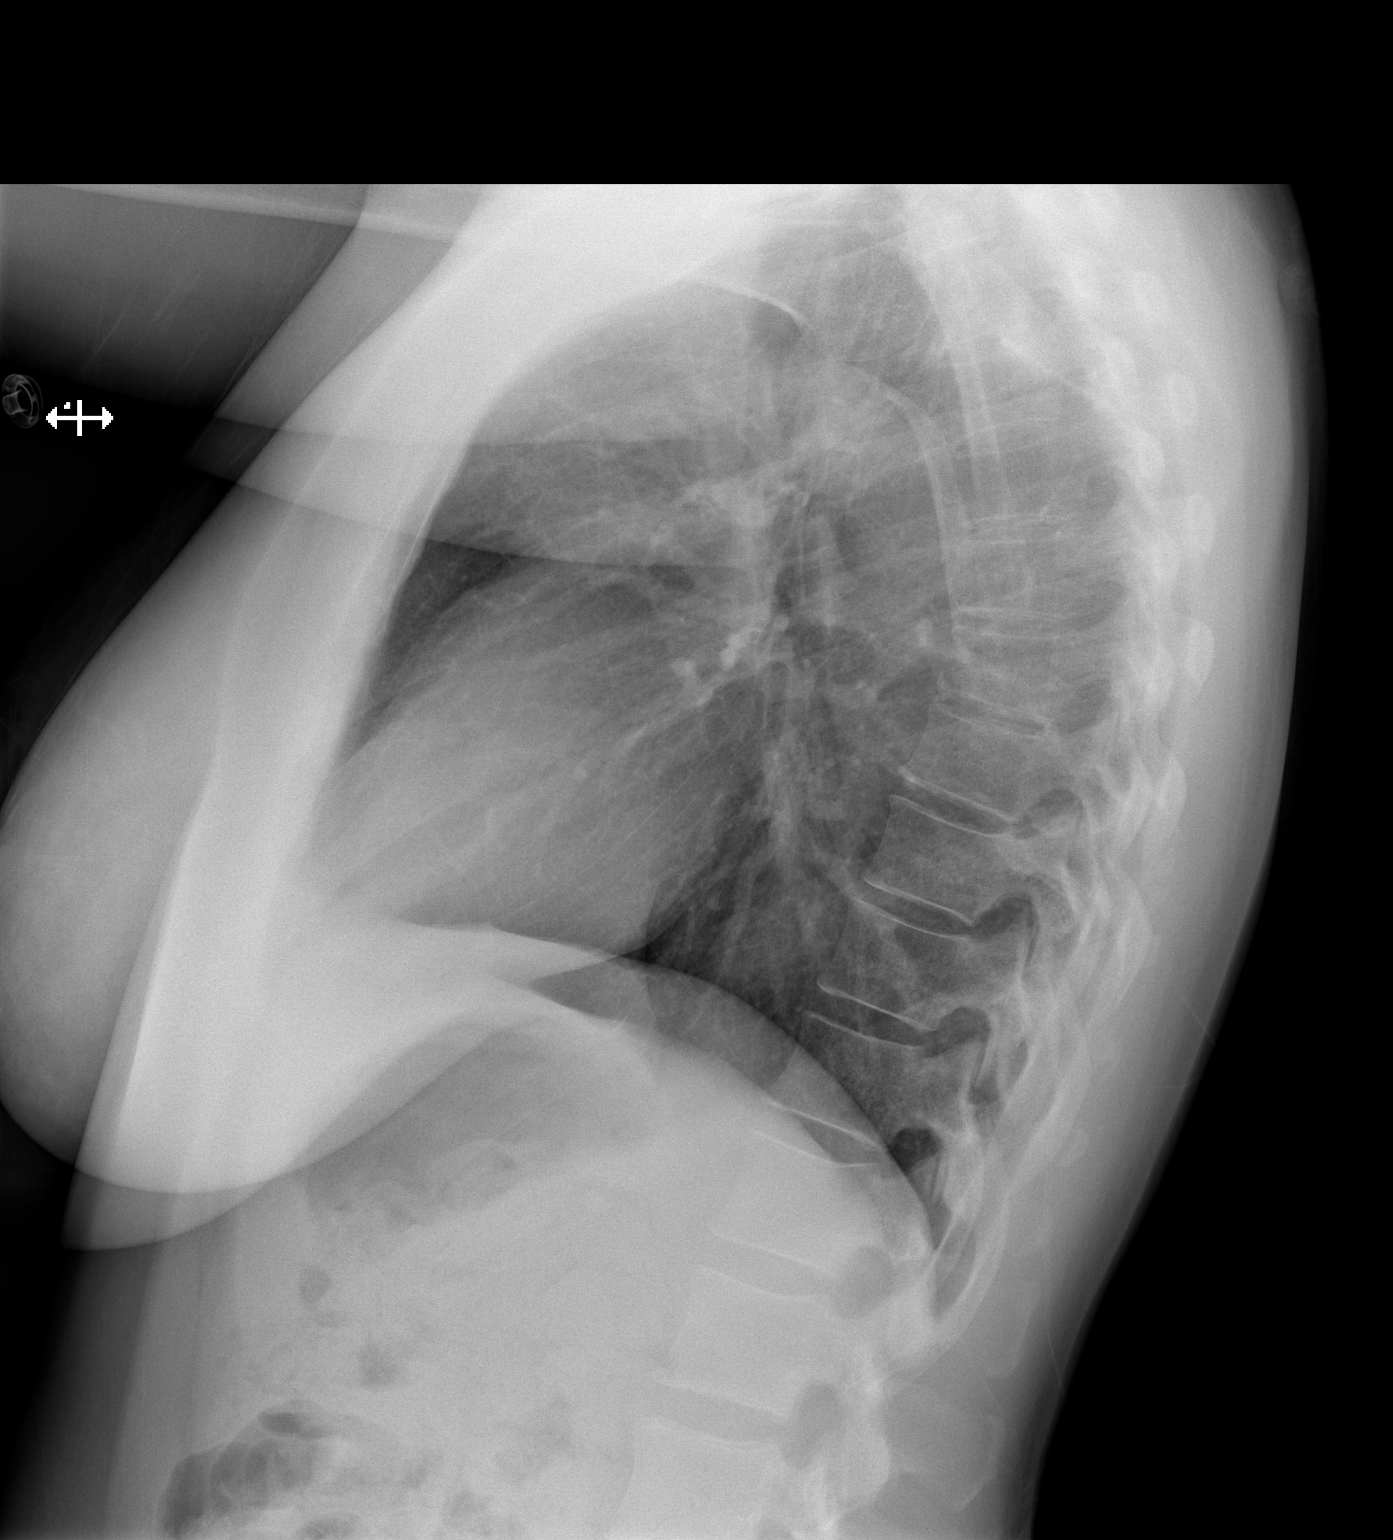

[2 of 2 positions shown; findings below may reference images not displayed]

FINDINGS: The heart size and mediastinal contours are within normal limits.
Both lungs are clear. The visualized skeletal structures are
unremarkable.
IMPRESSION: No active cardiopulmonary disease.

## 2016-03-11 DIAGNOSIS — J019 Acute sinusitis, unspecified: Secondary | ICD-10-CM | POA: Diagnosis not present

## 2016-05-25 DIAGNOSIS — R030 Elevated blood-pressure reading, without diagnosis of hypertension: Secondary | ICD-10-CM | POA: Diagnosis not present

## 2016-05-25 DIAGNOSIS — J309 Allergic rhinitis, unspecified: Secondary | ICD-10-CM | POA: Diagnosis not present

## 2016-05-25 DIAGNOSIS — Z832 Family history of diseases of the blood and blood-forming organs and certain disorders involving the immune mechanism: Secondary | ICD-10-CM | POA: Diagnosis not present

## 2016-05-25 DIAGNOSIS — Z13220 Encounter for screening for lipoid disorders: Secondary | ICD-10-CM | POA: Diagnosis not present

## 2016-05-25 DIAGNOSIS — Z8249 Family history of ischemic heart disease and other diseases of the circulatory system: Secondary | ICD-10-CM | POA: Diagnosis not present

## 2016-07-01 DIAGNOSIS — I10 Essential (primary) hypertension: Secondary | ICD-10-CM | POA: Diagnosis not present

## 2016-07-01 DIAGNOSIS — J309 Allergic rhinitis, unspecified: Secondary | ICD-10-CM | POA: Diagnosis not present

## 2016-09-09 ENCOUNTER — Ambulatory Visit: Payer: Federal, State, Local not specified - PPO | Admitting: Allergy

## 2016-10-21 DIAGNOSIS — K080 Exfoliation of teeth due to systemic causes: Secondary | ICD-10-CM | POA: Diagnosis not present

## 2016-10-26 DIAGNOSIS — J309 Allergic rhinitis, unspecified: Secondary | ICD-10-CM | POA: Diagnosis not present

## 2016-10-26 DIAGNOSIS — I10 Essential (primary) hypertension: Secondary | ICD-10-CM | POA: Diagnosis not present

## 2016-11-18 DIAGNOSIS — J029 Acute pharyngitis, unspecified: Secondary | ICD-10-CM | POA: Diagnosis not present

## 2016-11-18 DIAGNOSIS — Z91013 Allergy to seafood: Secondary | ICD-10-CM | POA: Diagnosis not present

## 2016-11-18 DIAGNOSIS — R1310 Dysphagia, unspecified: Secondary | ICD-10-CM | POA: Diagnosis not present

## 2016-12-18 ENCOUNTER — Ambulatory Visit (INDEPENDENT_AMBULATORY_CARE_PROVIDER_SITE_OTHER): Payer: Federal, State, Local not specified - PPO | Admitting: Allergy

## 2016-12-18 ENCOUNTER — Encounter: Payer: Self-pay | Admitting: Allergy

## 2016-12-18 VITALS — BP 130/78 | HR 100 | Temp 98.5°F | Resp 14 | Ht 64.0 in | Wt 145.0 lb

## 2016-12-18 DIAGNOSIS — H1010 Acute atopic conjunctivitis, unspecified eye: Secondary | ICD-10-CM

## 2016-12-18 DIAGNOSIS — Z91013 Allergy to seafood: Secondary | ICD-10-CM | POA: Diagnosis not present

## 2016-12-18 DIAGNOSIS — T781XXD Other adverse food reactions, not elsewhere classified, subsequent encounter: Secondary | ICD-10-CM

## 2016-12-18 DIAGNOSIS — J309 Allergic rhinitis, unspecified: Secondary | ICD-10-CM | POA: Diagnosis not present

## 2016-12-18 DIAGNOSIS — L500 Allergic urticaria: Secondary | ICD-10-CM | POA: Diagnosis not present

## 2016-12-18 MED ORDER — FEXOFENADINE HCL 180 MG PO TABS: 180.0000 mg | ORAL_TABLET | Freq: Two times a day (BID) | ORAL | 5 refills | Status: DC

## 2016-12-18 MED ORDER — DIPHENHYDRAMINE HCL 25 MG PO TABS: 25.0000 mg | ORAL_TABLET | Freq: Four times a day (QID) | ORAL | 0 refills | Status: DC | PRN

## 2016-12-18 MED ORDER — RANITIDINE HCL 150 MG PO TABS: 150.0000 mg | ORAL_TABLET | Freq: Two times a day (BID) | ORAL | 5 refills | Status: DC

## 2016-12-18 MED ORDER — OLOPATADINE HCL 0.7 % OP SOLN: 1.0000 [drp] | Freq: Every day | OPHTHALMIC | 5 refills | Status: DC | PRN

## 2016-12-18 MED ORDER — MONTELUKAST SODIUM 10 MG PO TABS: 10.0000 mg | ORAL_TABLET | Freq: Every day | ORAL | 5 refills | Status: DC

## 2016-12-18 MED ORDER — CETIRIZINE HCL 10 MG PO TABS: 10.0000 mg | ORAL_TABLET | Freq: Every day | ORAL | 0 refills | Status: DC

## 2016-12-18 NOTE — Progress Notes (Signed)
New Patient Note  RE: Kristen Rosario MRN: [id number] DOB: July 06, 1971 Date of Office Visit: 12/18/2016  Referring provider: Arnette Felts, FNP Primary care provider: Gwynneth Aliment, MD  Chief Complaint: Shrimp allergy, environmental allergy and itch  History of present illness: Kristen Rosario is a 46 y.o. female presenting today for consultation for allergies. She reports she has been having issues with food allergy and environmental allergy as well as a persistent itch with the rash. About 3 weeks ago she was on amoxicillin for 10 days and she was close to the end of her antibiotic course when she had shrimp on 3 different days.  She reports the first day after shrimp ingestion she didn't really note any symptoms but she did say she had lip swelling. The next day she developed difficulty swallowing as well as severe abdominal pain and vomiting following shrimp ingestion.  The last day she recalls vomiting again following shrimp ingestion. She states she did not want to waste the shrimp and did not really high these shrimp ingestion to her symptoms.  She was seen in her PCPs office following this reaction reports she got a shot as well as prednisone to take.  Serum IgE levels to foods were obtained that showed a sensitivity to shrimp.  She was provided with an EpiPen.  She is now avoiding shrimp. She does recall trying mussels for the first time about 10 years ago remember she had some swelling after that ingestion as well. She notes with walnuts may make her throat feeling itchy. She does report she is lactose intolerant. Her diet consists of a lot of vegetarian, fish, vegetables, egg.  She does state she has more itchiness and a hive-like rash following dark beer ingestion.   She does report having 'welts' and itching almost daily for years.   she reports the itching and rash occurs even outside of recent food ingestion. She does have significant nighttime itch.  She uses benadryl to help  with the rash and itch.   outside of the dark beer she has not been able to identify any other triggers   she does not recall any preceding illness, foods, medications, stings or changes in lotion soaps or detergents.  She denies any fevers, joint pains or aches and no marks or bruising her left hind by the rash.  She has runny and stuff nose, itchy puffy eyes symptoms are year round but worse in the summer.  She has nasonex 2 sprays as needed.  Zyrtec she uses as needed but also states she gets a little bit tired with this.  Xyzal makes her very tired No history of asthma.  She has history of eczema.      Review of systems: Review of Systems  Constitutional: Negative for chills, fever and malaise/fatigue.  HENT: Positive for congestion. Negative for ear discharge, ear pain, nosebleeds, sinus pain, sore throat and tinnitus.   Eyes: Negative for discharge and redness.  Respiratory: Negative for cough, shortness of breath and wheezing.   Cardiovascular: Negative for chest pain.  Gastrointestinal: Negative for abdominal pain, heartburn, nausea and vomiting.  Musculoskeletal: Negative for joint pain and myalgias.  Skin: Positive for itching and rash.  Neurological: Negative for headaches.    All other systems negative unless noted above in HPI  Past medical history: Past Medical History:  Diagnosis Date  . NFAOZHYQ(657.8)    "maybe monthly" (12/19/2013)  . Hypertension    "dx'd; never took RX" (12/19/2013)  . Urticaria   .  Vitreous hemorrhage of left eye [facility])     Past surgical history: Past Surgical History:  Procedure Laterality Date  . MEMBRANE PEEL Left 12/19/2013   Procedure: MEMBRANE PEEL;  Surgeon: Sherrie George, MD;  Location: [facility] OR;  Service: Ophthalmology;  Laterality: Left;  . PARS PLANA VITRECTOMY Left 12/19/2013  . PARS PLANA VITRECTOMY Left 12/19/2013   Procedure: PARS PLANA VITRECTOMY WITH 25 GAUGE; ENDOLASER;FLUID/AIR EXCHANGE;  Surgeon: Sherrie George, MD;  Location:  [facility] OR;  Service: Ophthalmology;  Laterality: Left;  . WISDOM TOOTH EXTRACTION  ~ 2001    Family history:  Family History  Problem Relation Age of Onset  . Allergic rhinitis Mother   . Allergic rhinitis Father   . Asthma Sister   . Asthma Brother     Social history: She lives in a home with carpeting with electric heating and central area there are no pets in the home. There is no concern for water damage, mildew or roaches in the home. [place] a [place] She has no smoking history    Medication List: Allergies as of 12/18/2016      Reactions   Sulfur Itching      Medication List       Accurate as of 12/18/16  5:03 PM. Always use your most recent med list.          cetirizine 10 MG tablet Commonly known as:  ZYRTEC Take 1 tablet (10 mg total) by mouth daily.   diphenhydrAMINE 25 MG tablet Commonly known as:  BENADRYL Take 1 tablet (25 mg total) by mouth every 6 (six) hours as needed.   EPIPEN 2-PAK 0.3 mg/0.3 mL Soaj injection Generic drug:  EPINEPHrine INJECT 0.3 MILLILITER BY INTRAMUSCULAR ROUTE ONCE AS NEEDED FOR ANAPHYLAXIS   fexofenadine 180 MG tablet Commonly known as:  ALLEGRA Take 1 tablet (180 mg total) by mouth 2 (two) times daily.   ibuprofen 200 MG tablet Commonly known as:  ADVIL,MOTRIN Take 200-400 mg by mouth every 6 (six) hours as needed for moderate pain.   losartan 25 MG tablet Commonly known as:  COZAAR TAKE 1 TABLET BY ORAL ROUTE 2 TIMES EVERY DAY   mometasone 50 MCG/ACT nasal spray Commonly known as:  NASONEX SPRAY 2 SPRAY BY INTRANASAL ROUTE EVERY DAY IN EACH NOSTRIL   montelukast 10 MG tablet Commonly known as:  SINGULAIR Take 1 tablet (10 mg total) by mouth at bedtime.   Olopatadine HCl 0.7 % Soln Commonly known as:  PAZEO Place 1 drop into both eyes daily as needed.   predniSONE 10 MG tablet Commonly known as:  DELTASONE USE AS FOLLOWS: 6,5,4,3,2,1   ranitidine 150 MG tablet Commonly known as:   ZANTAC Take 1 tablet (150 mg total) by mouth 2 (two) times daily.       Known medication allergies: Allergies  Allergen Reactions  . Sulfur Itching     Physical examination: Blood pressure 130/78, pulse 100, temperature 98.5 F (36.9 C), temperature source Oral, resp. rate 14, height  (1.626 m), weight 145 lb (65.8 kg), SpO2 98 %.  General: Alert, interactive, in no acute distress. HEENT: TMs pearly gray, turbinates mildly edematous without discharge, post-pharynx non erythematous. Neck: Supple without lymphadenopathy. Lungs: Clear to auscultation without wheezing, rhonchi or rales. {no increased work of breathing. CV: Normal S1, S2 without murmurs. Abdomen: Nondistended, nontender. Skin: Warm and dry, without lesions or rashes. Extremities:  No clubbing, cyanosis or edema. Neuro:   Grossly intact.  Diagnositics/Labs: Labs: review labs obtained by PCP from March 2018 in [facility]: Extensive food panel performed only positive to shrimp at 3.09.  Environmental allergen panel done only positive to dust mite (d.far 0.2, d.pter 0.43) CMP unremarkable  CBC unremarkable  TSH is normal   Allergy testing: Deferred due to recent reaction  Assessment and plan:   Food allergy    - shrimp IgE was positive and you have clinical history suggestive of shellfish allergy thus should continue to avoid shellfish at this time.        - have access to Epipen for as needed use in case of allergic reaction.  Follow emergency action plan in case of reaction    - Unable to skin test today as she had reaction within the past 4-6 weeks.  We'll plan to skin test in the future for shellfish.     pollen food allergy syndrome    - Discussed cross-reactivity among certain fruits and vegetables with pollens. Symptoms typically involve the mouth and does not progress past the mouth. She will continue to avoid walnuts.   Allergic rhinoconjunctivitis     -Your labwork was positive for dust mites.  Allergen  avoidance measures provided.     - take daily antihistamine (Zyrtec or Allegra)    - continue use of nasonex 2 sprays each nostril daily as needed for nasal congestion or drainage     - use antihistamine eye drop Pazeo or pataday 1 drop each eye as needed for itchy watery red eyes  Urticaria and pruritus    - at this time unclear cause of itch and hives    - will obtain histamine release assay lab.  The rest of your labs (CMP, CBC, TSH) you have done already are normal.      - start Zyrtec or Allegra twice a day, Zantac twice a day and Singulair at night     Follow-up this fall for skin testing (hold your antihistamine x 3 days for this visit)   I appreciate the opportunity to take part in Kristen Rosario's care. Please do not hesitate to contact me with questions.  Sincerely,   Margo Aye, MD Allergy/Immunology [facility]

## 2016-12-18 NOTE — Patient Instructions (Signed)
Food allergy    - shrimp IgE was positive and you have clinical history suggestive of shellfish allergy thus should continue to avoid shellfish at this time.     - have access to Epipen for as needed use in case of allergic reaction.  Follow emergency action plan in case of reaction   Environmental allergy    - your labwork was positive for dust mites.  Allergen avoidance measures provided.     - take daily antihistamine (Zyrtec or Allegra)    - continue use of nasonex 2 sprays each nostril daily as needed for nasal congestion or drainage     - use antihistamine eye drop Pazeo or pataday 1 drop each eye as needed for itchy watery red eyes  Persistent Itching and hives    - at this time unclear cause of itch and hives    - will obtain histamine release assay lab.  The rest of your labs you have done already are normal.      - start Zyrtec or Allegra twice a day, Zantac twice a day and Singulair at night     Follow-up this fall for skin testing (hold your antihistamine x 3 days for this visit)

## 2017-06-13 DIAGNOSIS — K0889 Other specified disorders of teeth and supporting structures: Secondary | ICD-10-CM | POA: Diagnosis not present

## 2017-06-13 DIAGNOSIS — J018 Other acute sinusitis: Secondary | ICD-10-CM | POA: Diagnosis not present

## 2017-06-25 DIAGNOSIS — K080 Exfoliation of teeth due to systemic causes: Secondary | ICD-10-CM | POA: Diagnosis not present

## 2017-07-26 ENCOUNTER — Other Ambulatory Visit: Payer: Self-pay | Admitting: Allergy

## 2017-07-26 DIAGNOSIS — L500 Allergic urticaria: Secondary | ICD-10-CM

## 2019-07-15 DIAGNOSIS — K122 Cellulitis and abscess of mouth: Secondary | ICD-10-CM | POA: Diagnosis not present

## 2019-07-15 DIAGNOSIS — I10 Essential (primary) hypertension: Secondary | ICD-10-CM | POA: Diagnosis not present

## 2020-02-21 ENCOUNTER — Telehealth: Payer: Self-pay

## 2020-02-21 ENCOUNTER — Ambulatory Visit: Payer: Federal, State, Local not specified - PPO | Admitting: Allergy

## 2020-02-21 ENCOUNTER — Encounter: Payer: Self-pay | Admitting: Allergy

## 2020-02-21 ENCOUNTER — Other Ambulatory Visit: Payer: Self-pay

## 2020-02-21 VITALS — BP 184/98 | HR 76 | Temp 97.9°F | Resp 18 | Ht 64.0 in | Wt 153.0 lb

## 2020-02-21 DIAGNOSIS — L508 Other urticaria: Secondary | ICD-10-CM

## 2020-02-21 DIAGNOSIS — J3089 Other allergic rhinitis: Secondary | ICD-10-CM | POA: Diagnosis not present

## 2020-02-21 DIAGNOSIS — T7800XD Anaphylactic reaction due to unspecified food, subsequent encounter: Secondary | ICD-10-CM | POA: Diagnosis not present

## 2020-02-21 DIAGNOSIS — I10 Essential (primary) hypertension: Secondary | ICD-10-CM | POA: Diagnosis not present

## 2020-02-21 MED ORDER — MONTELUKAST SODIUM 10 MG PO TABS: 10.0000 mg | ORAL_TABLET | Freq: Every day | ORAL | 5 refills | Status: DC

## 2020-02-21 NOTE — Patient Instructions (Addendum)
Chronic hives    - at this time etiology of hives and swelling is most like spontaneous or idiopathic.  Hives can be caused by a variety of different triggers including illness/infection, foods, medications, stings, exercise, pressure, vibrations, extremes of temperature to name a few however majority of the time there is no identifiable trigger.  Your symptoms have been ongoing for >6 weeks making this chronic thus will obtain labwork to evaluate: CBC w diff, CMP, tryptase, hive panel, alpha-gal panel     - resume high dose antihistamine regimen: Zyrtec or Allegra twice a day, Famotidine (Pepcid) 20mg  twice a day and Singulair at night   - if high-dose antihistamine regimen is not effective enough then would recommend Xolair monthly injections to better hive control.    Food allergy    - shrimp IgE testing was positive in 2018 and you have clinical history suggestive of shellfish allergy thus continue to avoid shellfish at this time.    Will repeat levels to see if going down.  - have access to Epipen for as needed use in case of allergic reaction.  Follow emergency action plan in case of reaction   Environmental allergy    - testing in 2018 was positive to dust mites.  Allergen avoidance measures provided.     - take daily antihistamine (Zyrtec or Allegra) as above    - unable to use medicated nose sprays    - recommend performing nasal saline rinses and using nasal saline spray to help flush out the nose/sinuses  High blood pressure   - BP in office today is elevated.  If you have any vision changes, headaches, dizziness, syncope (passing out), vomiting, racing heart or palpitations, severe pain then would go to ED for evaluation   - you will be called by [facility] regarding an upcoming appt   - would perform BP monitoring at home (or use of pharmacy like [facility], [facility]) for ambulatory monitoring and keep a record.       Follow-up in 2-3 months or sooner if needed

## 2020-02-21 NOTE — Progress Notes (Signed)
New Patient Note  RE: Kristen Rosario MRN: [id number] DOB: 07/28/1971 Date of Office Visit: 02/21/2020  Referring provider: No ref. provider found Primary care provider: Arnette Felts, FNP  Chief Complaint: hives  History of present illness: Kristen Rosario is a 49 y.o. female presenting today for evaluation of hives.   She was last seen in the office for in initial visit on 12/18/2016.  She returns today as she has had a resurgence of her hives. She states after the initial visit when she was taking the high-dose antihistamine regimen which she reports was 2 or 3 medicines that she was taking that it really improved her hives.  However when the recall of ranitidine occurred she had to stop this medication.  She still not okay for a while.  She started having more increase in hives in was also taking losartan for her blood pressure control.  She states the losartan was causing hives while she was on this medication.  She states when she stopped taking this medication that her hives did improve again.  She states she stopped taking losartan over a year ago.  She did not consult with her PCP at the time in regards to her stopping her losartan.  She states she has not seen her PCP in about a year and a half. More recently her hives have been breaking out more again despite not starting any new medications.  She has not had any dietary changes.  She denies any stings.  Has not changed any soaps/detergents or lotions.  Has not had any recent illnesses. Currently not taking any medications for the hives at this time. She overall states she has been very fatigued and not sleeping well and yesterday felt lightheaded without any syncope. She reports she still avoids shrimp/shellfish and that her EpiPen is expired.  She still avoid certain foods and vegetables as they call oral symptoms related to oral allergy syndrome.  She states with pollen this year she has been having more congestion and  drainage.  After last visit I did order several labs to work-up her hives or were these were never done.   Review of systems: Review of Systems  Constitutional: Negative.   HENT: Positive for congestion.   Eyes: Negative.   Respiratory: Negative.   Gastrointestinal: Negative.   Musculoskeletal: Negative.   Skin: Positive for itching and rash.  Neurological: Negative for sensory change, speech change, loss of consciousness, weakness and headaches.    All other systems negative unless noted above in HPI  Past medical history: Past Medical History:  Diagnosis Date  . [id number](478.2)    "maybe monthly" (12/19/2013)  . Hypertension    "dx'd; never took RX" (12/19/2013)  . Urticaria   . Vitreous hemorrhage of left eye [facility])     Past surgical history: Past Surgical History:  Procedure Laterality Date  . MEMBRANE PEEL Left 12/19/2013   Procedure: MEMBRANE PEEL;  Surgeon: Sherrie George, MD;  Location: [facility] OR;  Service: Ophthalmology;  Laterality: Left;  . PARS PLANA VITRECTOMY Left 12/19/2013  . PARS PLANA VITRECTOMY Left 12/19/2013   Procedure: PARS PLANA VITRECTOMY WITH 25 GAUGE; ENDOLASER;FLUID/AIR EXCHANGE;  Surgeon: Sherrie George, MD;  Location: [facility] OR;  Service: Ophthalmology;  Laterality: Left;  . WISDOM TOOTH EXTRACTION  ~ 2001    Family history:  Family History  Problem Relation Age of Onset  . Allergic rhinitis Mother   . Allergic rhinitis Father   . Asthma Sister   .  Asthma Brother     Social history: She lives in a house with carpeting with heat pump and central cooling.  There is concern for water damage or mildew in the home.  No concern for roaches in the home.  She job consists of handling x-rays, bags and patting down travelers.  Denies a smoking history.  Medication List: Current Outpatient Medications  Medication Sig Dispense Refill  . cetirizine (ZYRTEC) 10 MG tablet Take 1 tablet (10 mg total) by mouth daily. 30 tablet 0  . diphenhydrAMINE (BENADRYL)  25 MG tablet Take 1 tablet (25 mg total) by mouth every 6 (six) hours as needed. 30 tablet 0  . EPIPEN 2-PAK 0.3 MG/0.3ML SOAJ injection INJECT 0.3 MILLILITER BY INTRAMUSCULAR ROUTE ONCE AS NEEDED FOR ANAPHYLAXIS 2 each 1  . ibuprofen (ADVIL,MOTRIN) 200 MG tablet Take 200-400 mg by mouth every 6 (six) hours as needed for moderate pain.    . mometasone (NASONEX) 50 MCG/ACT nasal spray SPRAY 2 SPRAY BY INTRANASAL ROUTE EVERY DAY IN EACH NOSTRIL  2  . famotidine (PEPCID) 20 MG tablet Take 1 tablet (20 mg total) by mouth 2 (two) times daily. 60 tablet 5  . losartan (COZAAR) 25 MG tablet TAKE 1 TABLET BY ORAL ROUTE 2 TIMES EVERY DAY (Patient not taking: Reported on 02/21/2020)  2  . montelukast (SINGULAIR) 10 MG tablet Take 1 tablet (10 mg total) by mouth at bedtime. 30 tablet 5   No current facility-administered medications for this visit.    Known medication allergies: Allergies  Allergen Reactions  . Sulfur Itching     Physical examination: Blood pressure (!) 184/98, pulse 76, temperature 97.9 F (36.6 C), temperature source Temporal, resp. rate 18, height 5\' 4"  (1.626 m), weight 153 lb (69.4 kg), SpO2 100 %.  General: Alert, interactive, in no acute distress. HEENT: PERRLA, TMs pearly gray, turbinates non-edematous without discharge, post-pharynx non erythematous. Neck: Supple without lymphadenopathy. Lungs: Clear to auscultation without wheezing, rhonchi or rales. {no increased work of breathing. CV: Normal S1, S2 without murmurs. Abdomen: Nondistended, nontender. Skin: Warm and dry, without lesions or rashes. Extremities:  No clubbing, cyanosis or edema. Neuro:   Grossly intact.  Diagnositics/Labs: None today  Assessment and plan:   Chronic urticaria    - at this time etiology of hives and swelling is most like spontaneous or idiopathic.  Hives can be caused by a variety of different triggers including illness/infection, foods, medications, stings, exercise, pressure,  vibrations, extremes of temperature to Rosario a few however majority of the time there is no identifiable trigger.  Your symptoms have been ongoing for >6 weeks making this chronic thus will obtain labwork to evaluate: CBC w diff, CMP, tryptase, hive panel, alpha-gal panel     - resume high dose antihistamine regimen: Zyrtec or Allegra twice a day, Famotidine (Pepcid) 20mg  twice a day and Singulair at night   - if high-dose antihistamine regimen is not effective enough then would recommend Xolair monthly injections to better hive control.    Anaphylaxis due to food    - shrimp IgE testing was positive in 2018 and you have clinical history suggestive of shellfish allergy thus continue to avoid shellfish at this time.    Will repeat levels to see if going down.  - have access to Epipen for as needed use in case of allergic reaction.  Follow emergency action plan in case of reaction   Allergic rhinitis    - testing in 2018 was positive to dust mites.  Allergen avoidance measures provided.     - take daily antihistamine (Zyrtec or Allegra) as above    - unable to use medicated nose sprays    - recommend performing nasal saline rinses and using nasal saline spray to help flush out the nose/sinuses  High blood pressure   - BP in office today is elevated.  If you have any vision changes, headaches, dizziness, syncope (passing out), vomiting, racing heart or palpitations, severe pain then would go to ED for evaluation.  We did call her primary care office that she listed however they reported she has not been seen since 2018 and would be considered a new patient did not have any openings until August.   - you will be called by [facility] regarding an upcoming appt   - would perform BP monitoring at home (or use of pharmacy like [facility], [facility]) for ambulatory monitoring and keep a record.       Follow-up in 2-3 months or sooner if needed I appreciate the opportunity to take part in  Jorge's care. Please do not hesitate to contact me with questions.  Sincerely,   Margo Aye, MD Allergy/Immunology [facility]

## 2020-02-21 NOTE — Telephone Encounter (Signed)
Dr.Pagett's office called regarding patient's blood pressure being really high and asking if we needed any additional labs for her.  I advised them that this patient would be a new patient with [place] we have not seen her since 10/2016 we recommended that she go to the urgent care and be seen due to [place] not having any available appointment soon I offered her an appointment in August and pt declined since it is a month away and stated she will go somewhere else. YL,RMA

## 2020-02-21 NOTE — Telephone Encounter (Signed)
We are unable to provide a NP appt that soon and she has a full schedule. I would advise her to go to her PCP or a urgent care if unable to get in with them. She needs some immediate medical attn if that high.

## 2020-02-21 NOTE — Telephone Encounter (Signed)
Allergy and asthma called in about this patient because at her visit she had some very high blood pressures . There wanting to know if we can schedule her for a new patient appt and get her scheduled for this Friday 02/23/20.    Please advise

## 2020-02-22 ENCOUNTER — Other Ambulatory Visit: Payer: Self-pay | Admitting: *Deleted

## 2020-02-22 MED ORDER — EPIPEN 2-PAK 0.3 MG/0.3ML IJ SOAJ: INTRAMUSCULAR | 1 refills | Status: DC

## 2020-02-22 MED ORDER — FAMOTIDINE 20 MG PO TABS: 20.0000 mg | ORAL_TABLET | Freq: Two times a day (BID) | ORAL | 5 refills | Status: AC

## 2020-02-22 NOTE — Telephone Encounter (Signed)
[facility] from [facility], pepcid is not covered and pt will need to purchase OTC. Pt informed.

## 2020-02-26 ENCOUNTER — Encounter (HOSPITAL_COMMUNITY): Payer: Self-pay | Admitting: Emergency Medicine

## 2020-02-26 ENCOUNTER — Emergency Department (HOSPITAL_COMMUNITY)
Admission: EM | Admit: 2020-02-26 | Discharge: 2020-02-26 | Disposition: A | Discharge status: Home / Self Care | Payer: Federal, State, Local not specified - PPO | Attending: Emergency Medicine | Admitting: Emergency Medicine

## 2020-02-26 ENCOUNTER — Other Ambulatory Visit: Payer: Self-pay

## 2020-02-26 DIAGNOSIS — I10 Essential (primary) hypertension: Secondary | ICD-10-CM | POA: Insufficient documentation

## 2020-02-26 DIAGNOSIS — Z79899 Other long term (current) drug therapy: Secondary | ICD-10-CM | POA: Insufficient documentation

## 2020-02-26 MED ORDER — HYDROCHLOROTHIAZIDE 12.5 MG PO CAPS
25.0000 mg | ORAL_CAPSULE | Freq: Once | ORAL | Status: AC
  Administered 2020-02-26: 25 mg via ORAL
  Filled 2020-02-26: qty 2

## 2020-02-26 MED ORDER — HYDROCHLOROTHIAZIDE 25 MG PO TABS: 25.0000 mg | ORAL_TABLET | Freq: Every day | ORAL | 0 refills | Status: DC

## 2020-02-26 NOTE — ED Provider Notes (Signed)
[facility]-EMERGENCY DEPT Provider Note   CSN: [id number] Arrival date & time: 02/26/20  1405     History Chief Complaint  Patient presents with  . Hypertension  . Otalgia  . Dizziness    Kristen Rosario is a 49 y.o. female hx of HTN, previous vitreous hemorrhage here presenting with dizziness.  Patient has been feeling dizzy for the last several weeks.  Patient also has some ringing in her ears on the right side.  Patient has some mild headaches but denies any vomiting or weakness or trouble speaking.  Had a previous reaction to losartan before with a rash.  Patient was seen in allergy clinic last week and was noted to have a blood pressure in the 180s.  Patient was told that she needs follow-up with primary care doctor but unable to get in this week.  Patient was at work today and feels lightheaded and dizzy.  Denies any chest pain or shortness of breath.  Denies any abdominal pain.  The history is provided by the patient.       Past Medical History:  Diagnosis Date  . VOJJKKXF(818.2)    "maybe monthly" (12/19/2013)  . Hypertension    "dx'd; never took RX" (12/19/2013)  . Urticaria   . Vitreous hemorrhage of left eye [facility])     Patient Active Problem List   Diagnosis Date Noted  . Vitreous hemorrhage of left eye (HCC) 12/19/2013  . Vitreous hemorrhage (HCC) 12/14/2013    Past Surgical History:  Procedure Laterality Date  . MEMBRANE PEEL Left 12/19/2013   Procedure: MEMBRANE PEEL;  Surgeon: Sherrie George, MD;  Location: [facility] OR;  Service: Ophthalmology;  Laterality: Left;  . PARS PLANA VITRECTOMY Left 12/19/2013  . PARS PLANA VITRECTOMY Left 12/19/2013   Procedure: PARS PLANA VITRECTOMY WITH 25 GAUGE; ENDOLASER;FLUID/AIR EXCHANGE;  Surgeon: Sherrie George, MD;  Location: [facility] OR;  Service: Ophthalmology;  Laterality: Left;  . WISDOM TOOTH EXTRACTION  ~ 2001     OB History   No obstetric history on file.     Family History  Problem Relation Age of  Onset  . Allergic rhinitis Mother   . Allergic rhinitis Father   . Asthma Sister   . Asthma Brother     Social History   Tobacco Use  . Smoking status: Never Smoker  . Smokeless tobacco: Never Used  Vaping Use  . Vaping Use: Never used  Substance Use Topics  . Alcohol use: Yes    Alcohol/week: 4.0 standard drinks    Types: 2 Glasses of wine, 2 Cans of beer per week    Comment: 12/19/2013 "weekends may drink 3 - 4 drinks, wine or beer or liquor"  . Drug use: No    Home Medications Prior to Admission medications   Medication Sig Start Date End Date Taking? Authorizing Provider  cetirizine (ZYRTEC) 10 MG tablet Take 1 tablet (10 mg total) by mouth daily. 12/18/16   Marcelyn Bruins, MD  diphenhydrAMINE (BENADRYL) 25 MG tablet Take 1 tablet (25 mg total) by mouth every 6 (six) hours as needed. 12/18/16   Marcelyn Bruins, MD  EPIPEN 2-PAK 0.3 MG/0.3ML SOAJ injection INJECT 0.3 MILLILITER BY INTRAMUSCULAR ROUTE ONCE AS NEEDED FOR ANAPHYLAXIS 02/22/20   Padgett, Pilar Grammes, MD  famotidine (PEPCID) 20 MG tablet Take 1 tablet (20 mg total) by mouth 2 (two) times daily. 02/22/20   Marcelyn Bruins, MD  ibuprofen (ADVIL,MOTRIN) 200 MG tablet Take 200-400 mg by mouth every  6 (six) hours as needed for moderate pain.    [provider]  losartan (COZAAR) 25 MG tablet TAKE 1 TABLET BY ORAL ROUTE 2 TIMES EVERY DAY Patient not taking: Reported on 02/21/2020 11/29/16   [provider]  mometasone (NASONEX) 50 MCG/ACT nasal spray SPRAY 2 SPRAY BY INTRANASAL ROUTE EVERY DAY IN EACH NOSTRIL 11/19/16   [provider]  montelukast (SINGULAIR) 10 MG tablet Take 1 tablet (10 mg total) by mouth at bedtime. 02/21/20   Marcelyn Bruins, MD    Allergies    Sulfur  Review of Systems   Review of Systems  Neurological: Positive for dizziness.  All other systems reviewed and are negative.   Physical Exam Updated Vital Signs BP (!) 190/104 (BP  Location: Right Arm)   Pulse 79   Temp 98.3 F (36.8 C) (Oral)   Resp 18   SpO2 100%   Physical Exam Vitals and nursing note reviewed.  Constitutional:      Appearance: Normal appearance.  HENT:     Head: Normocephalic.     Left Ear: Tympanic membrane normal.     Ears:     Comments: R ear canal with cerumen, no obvious otitis media     Mouth/Throat:     Mouth: Mucous membranes are moist.  Eyes:     Extraocular Movements: Extraocular movements intact.     Pupils: Pupils are equal, round, and reactive to light.  Cardiovascular:     Rate and Rhythm: Normal rate and regular rhythm.     Pulses: Normal pulses.     Heart sounds: Normal heart sounds.  Pulmonary:     Effort: Pulmonary effort is normal.     Breath sounds: Normal breath sounds.  Abdominal:     General: Abdomen is flat.     Palpations: Abdomen is soft.  Musculoskeletal:        General: Normal range of motion.     Cervical back: Normal range of motion.  Skin:    General: Skin is warm.     Capillary Refill: Capillary refill takes less than 2 seconds.  Neurological:     General: No focal deficit present.     Mental Status: She is alert and oriented to person, place, and time.     Comments: CN 2- 12 intact, nl strength and sensation throughout, nl gait   Psychiatric:        Mood and Affect: Mood normal.     ED Results / Procedures / Treatments   Labs (all labs ordered are listed, but only abnormal results are displayed) Labs Reviewed - No data to display  EKG None  Radiology No results found.  Procedures Procedures (including critical care time)  Medications Ordered in ED Medications  hydrochlorothiazide (MICROZIDE) capsule 25 mg (25 mg Oral Given 02/26/20 1705)    ED Course  I have reviewed the triage vital signs and the nursing notes.  Pertinent labs & imaging results that were available during my care of the patient were reviewed by me and considered in my medical decision making (see chart for  details).    MDM Rules/Calculators/A&P                          Kristen Rosario is a 49 y.o. female presenting with hypertension.  Patient likely has symptomatic hypertension.  Patient's blood pressure was 180 in the office about a week ago.  Patient has some mild dizziness but denies any  weakness or chest pain.  Patient had labs done in the office a week ago and CBC and CMP were unremarkable.  Patient was on losartan with some side effects.  Will try HCTZ 25 mg daily.  We will have her follow-up with PCP in a week.  She likely will need a second agent as well.  Final Clinical Impression(s) / ED Diagnoses Final diagnoses:  None    Rx / DC Orders ED Discharge Orders    None       Charlynne Pander, MD 02/26/20 [phone]

## 2020-02-26 NOTE — Discharge Instructions (Signed)
Take HCTZ daily.   You need to see your doctor in week to recheck blood pressure.  Return to ER if you have worse dizziness, headaches, weakness, trouble speaking, chest pain.

## 2020-02-26 NOTE — ED Triage Notes (Signed)
Pt reports has hx HTN and not on medications currently. Reports intermittent right ear pains. Today at work had dizziness. Reports trying to get in to a PCP. [facility].

## 2020-02-29 LAB — CBC WITH DIFFERENTIAL/PLATELET
Basophils Absolute: 0 10*3/uL (ref 0.0–0.2)
Basos: 1 %
EOS (ABSOLUTE): 0.1 10*3/uL (ref 0.0–0.4)
Eos: 4 %
Hematocrit: 38.2 % (ref 34.0–46.6)
Hemoglobin: 11.6 g/dL (ref 11.1–15.9)
Immature Grans (Abs): 0 10*3/uL (ref 0.0–0.1)
Immature Granulocytes: 0 %
Lymphocytes Absolute: 0.6 10*3/uL — ABNORMAL LOW (ref 0.7–3.1)
Lymphs: 19 %
MCH: 26.4 pg — ABNORMAL LOW (ref 26.6–33.0)
MCHC: 30.4 g/dL — ABNORMAL LOW (ref 31.5–35.7)
MCV: 87 fL (ref 79–97)
Monocytes Absolute: 0.5 10*3/uL (ref 0.1–0.9)
Monocytes: 17 %
Neutrophils Absolute: 1.7 10*3/uL (ref 1.4–7.0)
Neutrophils: 59 %
Platelets: 236 10*3/uL (ref 150–450)
RBC: 4.4 x10E6/uL (ref 3.77–5.28)
RDW: 13.6 % (ref 11.7–15.4)
WBC: 2.9 10*3/uL — ABNORMAL LOW (ref 3.4–10.8)

## 2020-02-29 LAB — COMPREHENSIVE METABOLIC PANEL
ALT: 10 IU/L (ref 0–32)
AST: 15 IU/L (ref 0–40)
Albumin/Globulin Ratio: 1.5 (ref 1.2–2.2)
Albumin: 4.8 g/dL (ref 3.8–4.8)
Alkaline Phosphatase: 61 IU/L (ref 48–121)
BUN/Creatinine Ratio: 21 (ref 9–23)
BUN: 14 mg/dL (ref 6–24)
Bilirubin Total: 0.6 mg/dL (ref 0.0–1.2)
CO2: 24 mmol/L (ref 20–29)
Calcium: 9.5 mg/dL (ref 8.7–10.2)
Chloride: 99 mmol/L (ref 96–106)
Creatinine, Ser: 0.68 mg/dL (ref 0.57–1.00)
GFR calc Af Amer: 118 mL/min/{1.73_m2} (ref >59)
GFR calc non Af Amer: 102 mL/min/{1.73_m2} (ref >59)
Globulin, Total: 3.1 g/dL (ref 1.5–4.5)
Glucose: 97 mg/dL (ref 65–99)
Potassium: 3.9 mmol/L (ref 3.5–5.2)
Sodium: 137 mmol/L (ref 134–144)
Total Protein: 7.9 g/dL (ref 6.0–8.5)

## 2020-02-29 LAB — ALLERGEN PROFILE, SHELLFISH
Clam IgE: <0.1 kU/L
F023-IgE Crab: <0.1 kU/L
F080-IgE Lobster: <0.1 kU/L
F290-IgE Oyster: <0.1 kU/L
Scallop IgE: 0.12 kU/L — AB
Shrimp IgE: 2.45 kU/L — AB

## 2020-02-29 LAB — ALPHA-GAL PANEL
Alpha Gal IgE*: <0.1 kU/L (ref <0.10)
Beef (Bos spp) IgE: <0.1 kU/L (ref <0.35)
Class Interpretation: 0
Class Interpretation: 0
Class Interpretation: 0
Lamb/Mutton (Ovis spp) IgE: <0.1 kU/L (ref <0.35)
Pork (Sus spp) IgE: <0.1 kU/L (ref <0.35)

## 2020-02-29 LAB — CHRONIC URTICARIA: cu index: 2.4 (ref <10)

## 2020-02-29 LAB — TRYPTASE: Tryptase: 3.5 ug/L (ref 2.2–13.2)

## 2020-03-15 ENCOUNTER — Other Ambulatory Visit: Payer: Self-pay

## 2020-03-15 ENCOUNTER — Encounter: Payer: Self-pay | Admitting: Family Medicine

## 2020-03-15 ENCOUNTER — Ambulatory Visit: Payer: Federal, State, Local not specified - PPO | Admitting: Family Medicine

## 2020-03-15 VITALS — BP 145/79 | HR 71 | Temp 98.1°F | Ht 64.0 in | Wt 148.0 lb

## 2020-03-15 DIAGNOSIS — I10 Essential (primary) hypertension: Secondary | ICD-10-CM | POA: Insufficient documentation

## 2020-03-15 DIAGNOSIS — Z7689 Persons encountering health services in other specified circumstances: Secondary | ICD-10-CM

## 2020-03-15 MED ORDER — AMLODIPINE BESYLATE 2.5 MG PO TABS: 2.5000 mg | ORAL_TABLET | Freq: Every day | ORAL | 1 refills | Status: DC

## 2020-03-15 MED ORDER — HYDROCHLOROTHIAZIDE 25 MG PO TABS: 25.0000 mg | ORAL_TABLET | Freq: Every day | ORAL | 0 refills | Status: DC

## 2020-03-15 MED ORDER — HYDROCHLOROTHIAZIDE 25 MG PO TABS: 25.0000 mg | ORAL_TABLET | Freq: Every day | ORAL | 1 refills | Status: DC

## 2020-03-15 NOTE — Patient Instructions (Signed)
Continue the hydrochlorothiazide.  Added amlodipine 1 tab a day.  Followup in 3 months.    Hypertension, Adult Hypertension is another name for high blood pressure. High blood pressure forces your heart to work harder to pump blood. This can cause problems over time. There are two numbers in a blood pressure reading. There is a top number (systolic) over a bottom number (diastolic). It is best to have a blood pressure that is below 120/80. Healthy choices can help lower your blood pressure, or you may need medicine to help lower it. What are the causes? The cause of this condition is not known. Some conditions may be related to high blood pressure. What increases the risk?  Smoking.  Having type 2 diabetes mellitus, high cholesterol, or both.  Not getting enough exercise or physical activity.  Being overweight.  Having too much fat, sugar, calories, or salt (sodium) in your diet.  Drinking too much alcohol.  Having long-term (chronic) kidney disease.  Having a family history of high blood pressure.  Age. Risk increases with age.  Race. You may be at higher risk if you are African American.  Gender. Men are at higher risk than women before age 34. After age 81, women are at higher risk than men.  Having obstructive sleep apnea.  Stress. What are the signs or symptoms?  High blood pressure may not cause symptoms. Very high blood pressure (hypertensive crisis) may cause: ? Headache. ? Feelings of worry or nervousness (anxiety). ? Shortness of breath. ? Nosebleed. ? A feeling of being sick to your stomach (nausea). ? Throwing up (vomiting). ? Changes in how you see. ? Very bad chest pain. ? Seizures. How is this treated?  This condition is treated by making healthy lifestyle changes, such as: ? Eating healthy foods. ? Exercising more. ? Drinking less alcohol.  Your health care provider may prescribe medicine if lifestyle changes are not enough to get your blood  pressure under control, and if: ? Your top number is above 130. ? Your bottom number is above 80.  Your personal target blood pressure may vary. Follow these instructions at home: Eating and drinking   If told, follow the DASH eating plan. To follow this plan: ? Fill one half of your plate at each meal with fruits and vegetables. ? Fill one fourth of your plate at each meal with whole grains. Whole grains include whole-wheat pasta, brown rice, and whole-grain bread. ? Eat or drink low-fat dairy products, such as skim milk or low-fat yogurt. ? Fill one fourth of your plate at each meal with low-fat (lean) proteins. Low-fat proteins include fish, chicken without skin, eggs, beans, and tofu. ? Avoid fatty meat, cured and processed meat, or chicken with skin. ? Avoid pre-made or processed food.  Eat less than 1,500 mg of salt each day.  Do not drink alcohol if: ? Your doctor tells you not to drink. ? You are pregnant, may be pregnant, or are planning to become pregnant.  If you drink alcohol: ? Limit how much you use to:  0-1 drink a day for women.  0-2 drinks a day for men. ? Be aware of how much alcohol is in your drink. In the [place] one drink equals one 12 oz bottle of beer (355 mL), one 5 oz glass of wine (148 mL), or one 1 oz glass of hard liquor (44 mL). Lifestyle   Work with your doctor to stay at a healthy weight or to lose weight. Ask  your doctor what the best weight is for you.  Get at least 30 minutes of exercise most days of the week. This may include walking, swimming, or biking.  Get at least 30 minutes of exercise that strengthens your muscles (resistance exercise) at least 3 days a week. This may include lifting weights or doing Pilates.  Do not use any products that contain nicotine or tobacco, such as cigarettes, e-cigarettes, and chewing tobacco. If you need help quitting, ask your doctor.  Check your blood pressure at home as told by your doctor.  Keep all  follow-up visits as told by your doctor. This is important. Medicines  Take over-the-counter and prescription medicines only as told by your doctor. Follow directions carefully.  Do not skip doses of blood pressure medicine. The medicine does not work as well if you skip doses. Skipping doses also puts you at risk for problems.  Ask your doctor about side effects or reactions to medicines that you should watch for. Contact a doctor if you:  Think you are having a reaction to the medicine you are taking.  Have headaches that keep coming back (recurring).  Feel dizzy.  Have swelling in your ankles.  Have trouble with your vision. Get help right away if you:  Get a very bad headache.  Start to feel mixed up (confused).  Feel weak or numb.  Feel faint.  Have very bad pain in your: ? Chest. ? Belly (abdomen).  Throw up more than once.  Have trouble breathing. Summary  Hypertension is another name for high blood pressure.  High blood pressure forces your heart to work harder to pump blood.  For most people, a normal blood pressure is less than 120/80.  Making healthy choices can help lower blood pressure. If your blood pressure does not get lower with healthy choices, you may need to take medicine. This information is not intended to replace advice given to you by your health care provider. Make sure you discuss any questions you have with your health care provider. Document Revised: 04/20/2018 Document Reviewed: 04/20/2018 Elsevier Patient Education  2020 Else[employer] Inc[employer]   Low-Sodium Eating Plan Sodium, which is an element that makes up salt, helps you maintain a healthy balance of fluids in your body. Too much sodium can increase your blood pressure and cause fluid and waste to be held in your body. Your health care provider or dietitian may recommend following this plan if you have high blood pressure (hypertension), kidney disease, liver disease, or heart failure.  Eating less sodium can help lower your blood pressure, reduce swelling, and protect your heart, liver, and kidneys. What are tips for following this plan? General guidelines  Most people on this plan should limit their sodium intake to 1,500-2,000 mg (milligrams) of sodium each day. Reading food labels   The Nutrition Facts label lists the amount of sodium in one serving of the food. If you eat more than one serving, you must multiply the listed amount of sodium by the number of servings.  Choose foods with less than 140 mg of sodium per serving.  Avoid foods with 300 mg of sodium or more per serving. Shopping  Look for lower-sodium products, often labeled as "low-sodium" or "no salt added."  Always check the sodium content even if foods are labeled as "unsalted" or "no salt added".  Buy fresh foods. ? Avoid canned foods and premade or frozen meals. ? Avoid canned, cured, or processed meats  Buy breads that have less  than 80 mg of sodium per slice. Cooking  Eat more home-cooked food and less restaurant, buffet, and fast food.  Avoid adding salt when cooking. Use salt-free seasonings or herbs instead of table salt or sea salt. Check with your health care provider or pharmacist before using salt substitutes.  Cook with plant-based oils, such as canola, sunflower, or olive oil. Meal planning  When eating at a restaurant, ask that your food be prepared with less salt or no salt, if possible.  Avoid foods that contain MSG (monosodium glutamate). MSG is sometimes added to [place] food, bouillon, and some canned foods. What foods are recommended? The items listed may not be a complete list. Talk with your dietitian about what dietary choices are best for you. Grains Low-sodium cereals, including oats, puffed wheat and rice, and shredded wheat. Low-sodium crackers. Unsalted rice. Unsalted pasta. Low-sodium bread. Whole-grain breads and whole-grain pasta. Vegetables Fresh or frozen  vegetables. "No salt added" canned vegetables. "No salt added" tomato sauce and paste. Low-sodium or reduced-sodium tomato and vegetable juice. Fruits Fresh, frozen, or canned fruit. Fruit juice. Meats and other protein foods Fresh or frozen (no salt added) meat, poultry, seafood, and fish. Low-sodium canned tuna and salmon. Unsalted nuts. Dried peas, beans, and lentils without added salt. Unsalted canned beans. Eggs. Unsalted nut butters. Dairy Milk. Soy milk. Cheese that is naturally low in sodium, such as ricotta cheese, fresh mozzarella, or Swiss cheese Low-sodium or reduced-sodium cheese. Cream cheese. Yogurt. Fats and oils Unsalted butter. Unsalted margarine with no trans fat. Vegetable oils such as canola or olive oils. Seasonings and other foods Fresh and dried herbs and spices. Salt-free seasonings. Low-sodium mustard and ketchup. Sodium-free salad dressing. Sodium-free light mayonnaise. Fresh or refrigerated horseradish. Lemon juice. Vinegar. Homemade, reduced-sodium, or low-sodium soups. Unsalted popcorn and pretzels. Low-salt or salt-free chips. What foods are not recommended? The items listed may not be a complete list. Talk with your dietitian about what dietary choices are best for you. Grains Instant hot cereals. Bread stuffing, pancake, and biscuit mixes. Croutons. Seasoned rice or pasta mixes. Noodle soup cups. Boxed or frozen macaroni and cheese. Regular salted crackers. Self-rising flour. Vegetables Sauerkraut, pickled vegetables, and relishes. Olives. [place] fries. Onion rings. Regular canned vegetables (not low-sodium or reduced-sodium). Regular canned tomato sauce and paste (not low-sodium or reduced-sodium). Regular tomato and vegetable juice (not low-sodium or reduced-sodium). Frozen vegetables in sauces. Meats and other protein foods Meat or fish that is salted, canned, smoked, spiced, or pickled. Bacon, ham, sausage, hotdogs, corned beef, chipped beef, packaged lunch  meats, salt pork, jerky, pickled herring, anchovies, regular canned tuna, sardines, salted nuts. Dairy Processed cheese and cheese spreads. Cheese curds. Blue cheese. Feta cheese. String cheese. Regular cottage cheese. Buttermilk. Canned milk. Fats and oils Salted butter. Regular margarine. Ghee. Bacon fat. Seasonings and other foods Onion salt, garlic salt, seasoned salt, table salt, and sea salt. Canned and packaged gravies. Worcestershire sauce. Tartar sauce. Barbecue sauce. Teriyaki sauce. Soy sauce, including reduced-sodium. Steak sauce. Fish sauce. Oyster sauce. Cocktail sauce. Horseradish that you find on the shelf. Regular ketchup and mustard. Meat flavorings and tenderizers. Bouillon cubes. Hot sauce and Tabasco sauce. Premade or packaged marinades. Premade or packaged taco seasonings. Relishes. Regular salad dressings. Salsa. Potato and tortilla chips. Corn chips and puffs. Salted popcorn and pretzels. Canned or dried soups. Pizza. Frozen entrees and pot pies. Summary  Eating less sodium can help lower your blood pressure, reduce swelling, and protect your heart, liver, and kidneys.  Most people  on this plan should limit their sodium intake to 1,500-2,000 mg (milligrams) of sodium each day.  Canned, boxed, and frozen foods are high in sodium. Restaurant foods, fast foods, and pizza are also very high in sodium. You also get sodium by adding salt to food.  Try to cook at home, eat more fresh fruits and vegetables, and eat less fast food, canned, processed, or prepared foods. This information is not intended to replace advice given to you by your health care provider. Make sure you discuss any questions you have with your health care provider. Document Revised: 07/23/2017 Document Reviewed: 08/03/2016 Elsevier Patient Education  2020 [employer].

## 2020-03-15 NOTE — Progress Notes (Signed)
Patient ID: Kristen Rosario, female  DOB: Aug 29, 1970, 49 y.o.   MRN: [id number] Patient Care Team    Relationship Specialty Notifications Start End  Natalia Leatherwood, DO PCP - General Family Medicine  03/15/20     Chief Complaint  Patient presents with  . Establish Care    Subjective:  Kristen Rosario is a 49 y.o.  female present for new patient establishment. All past medical history, surgical history, allergies, family history, immunizations, medications and social history were updated in the electronic medical record today. All recent labs, ED visits and hospitalizations within the last year were reviewed.  Hypertension:  Pt reports compliance with hctz 25 mg a day, that was started 4 weeks ago. She has a h/o HTN and had been on HCTZ and losartan at one time. She felt she was having a rash to losartan and had stopped. Patient denies chest pain, shortness of breath or lower extremity edema.  She has changed her diet and trying to eat lower sodium and exercise. [place]   Depression screen [facility] 2/9 03/15/2020  Decreased Interest 0  Down, Depressed, Hopeless 0  PHQ - 2 Score 0  Altered sleeping 1  Tired, decreased energy 0  Change in appetite 0  Feeling bad or failure about yourself  0  Trouble concentrating 0  Moving slowly or fidgety/restless 0  Suicidal thoughts 0  PHQ-9 Score 1  Difficult doing work/chores Not difficult at all   No flowsheet data found.     No flowsheet data found.  There is no immunization history on file for this patient.  No exam data present  Past Medical History:  Diagnosis Date  . NWGNFAOZ(308.6)    "maybe monthly" (12/19/2013)  . Hypertension    "dx'd; never took RX" (12/19/2013)  . Urticaria   . Vitreous hemorrhage of left eye (HCC)    Allergies  Allergen Reactions  . Losartan     rash  . Sulfur Itching   Past Surgical History:  Procedure Laterality Date  . MEMBRANE PEEL Left 12/19/2013    Procedure: MEMBRANE PEEL;  Surgeon: Sherrie George, MD;  Location: [facility] OR;  Service: Ophthalmology;  Laterality: Left;  . PARS PLANA VITRECTOMY Left 12/19/2013  . PARS PLANA VITRECTOMY Left 12/19/2013   Procedure: PARS PLANA VITRECTOMY WITH 25 GAUGE; ENDOLASER;FLUID/AIR EXCHANGE;  Surgeon: Sherrie George, MD;  Location: [facility] OR;  Service: Ophthalmology;  Laterality: Left;  . WISDOM TOOTH EXTRACTION  ~ 2001   Family History  Problem Relation Age of Onset  . Allergic rhinitis Mother   . Hypertension Mother   . Allergic rhinitis Father   . Asthma Sister   . Hypertension Sister   . Asthma Brother   . Hypertension Brother    Social History   Social History Narrative  . Not on file    Allergies as of 03/15/2020      Reactions   Losartan    rash   Sulfur Itching      Medication List       Accurate as of March 15, 2020  2:08 PM. If you have any questions, ask your nurse or doctor.        STOP taking these medications   ibuprofen 200 MG tablet Commonly known as: ADVIL Stopped by: Felix Pacini, DO   losartan 25 MG tablet Commonly known as: COZAAR Stopped by: Felix Pacini, DO   mometasone 50 MCG/ACT nasal spray Commonly known as:  NASONEX Stopped by: Felix Pacini, DO     TAKE these medications   amLODipine 2.5 MG tablet Commonly known as: NORVASC Take 1 tablet (2.5 mg total) by mouth daily. Started by: Felix Pacini, DO   cetirizine 10 MG tablet Commonly known as: ZYRTEC Take 1 tablet (10 mg total) by mouth daily.   diphenhydrAMINE 25 MG tablet Commonly known as: Benadryl Take 1 tablet (25 mg total) by mouth every 6 (six) hours as needed.   EpiPen 2-Pak 0.3 mg/0.3 mL Soaj injection Generic drug: EPINEPHrine INJECT 0.3 MILLILITER BY INTRAMUSCULAR ROUTE ONCE AS NEEDED FOR ANAPHYLAXIS   famotidine 20 MG tablet Commonly known as: PEPCID Take 1 tablet (20 mg total) by mouth 2 (two) times daily.   hydrochlorothiazide 25 MG tablet Commonly known as: HYDRODIURIL Take 1  tablet (25 mg total) by mouth daily.   montelukast 10 MG tablet Commonly known as: Singulair Take 1 tablet (10 mg total) by mouth at bedtime.   sodium chloride 0.65 % Soln nasal spray Commonly known as: OCEAN Place 1 spray into both nostrils as needed for congestion.       All past medical history, surgical history, allergies, family history, immunizations andmedications were updated in the EMR today and reviewed under the history and medication portions of their EMR.     No results found.   ROS: 14 pt review of systems performed and negative (unless mentioned in an HPI)  Objective: BP (!) 145/79   Pulse 71   Temp 98.1 F (36.7 C) (Temporal)   Ht 5\' 4"  (1.626 m)   Wt 148 lb (67.1 kg)   SpO2 98%   BMI 25.40 kg/m  Gen: Afebrile. No acute distress. Nontoxic in appearance, well-developed, well-nourished,  Very pleasant female.  HENT: AT. [place].  Eyes:Pupils Equal Round Reactive to light, Extraocular movements intact,  Conjunctiva without redness, discharge or icterus. CV: RRR no murmur, trace edema, +2/4 P posterior tibialis pulses. Chest: CTAB, no wheeze, rhonchi or crackles. normal Respiratory effort. good Air movement. Neuro/Msk: Normal gait. PERLA. EOMi. Alert. Oriented x3.   Psych: Normal affect, dress and demeanor. Normal speech. Normal thought content and judgment.  Assessment/plan: LODEMA PARMA is a 49 y.o. female present for  Essential hypertension Above goal of <135/85. Ideally 120s/70s Continue HCTZ 25 mg QD Added amlodipine 2.5 mg QD.  Low salt diet Exercise routinely.  Reviewed resent labs.  F/u 3 mos. Once stable will follow q 6 mos.      Return in about 3 months (around 06/15/2020) for [facility] (30 min).  No orders of the defined types were placed in this encounter.  Meds ordered this encounter  Medications  . DISCONTD: hydrochlorothiazide (HYDRODIURIL) 25 MG tablet    Sig: Take 1 tablet (25 mg total) by mouth daily.    Dispense:  30 tablet     Refill:  0  . amLODipine (NORVASC) 2.5 MG tablet    Sig: Take 1 tablet (2.5 mg total) by mouth daily.    Dispense:  90 tablet    Refill:  1  . hydrochlorothiazide (HYDRODIURIL) 25 MG tablet    Sig: Take 1 tablet (25 mg total) by mouth daily.    Dispense:  90 tablet    Refill:  1   Referral Orders  No referral(s) requested today     Note is dictated utilizing voice recognition software. Although note has been proof read prior to signing, occasional typographical errors still can be missed. If any questions arise, please do not hesitate  to call for verification.  Electronically signed by: Howard Pouch, DO [place]

## 2020-06-07 ENCOUNTER — Encounter: Payer: Self-pay | Admitting: Family Medicine

## 2020-06-07 ENCOUNTER — Other Ambulatory Visit: Payer: Self-pay

## 2020-06-07 ENCOUNTER — Ambulatory Visit (INDEPENDENT_AMBULATORY_CARE_PROVIDER_SITE_OTHER): Payer: Federal, State, Local not specified - PPO | Admitting: Family Medicine

## 2020-06-07 VITALS — BP 106/68 | HR 64 | Temp 98.0°F | Ht 64.0 in | Wt 155.0 lb

## 2020-06-07 DIAGNOSIS — I10 Essential (primary) hypertension: Secondary | ICD-10-CM | POA: Diagnosis not present

## 2020-06-07 MED ORDER — AMLODIPINE BESYLATE 2.5 MG PO TABS: 2.5000 mg | ORAL_TABLET | Freq: Every day | ORAL | 1 refills | Status: DC

## 2020-06-07 MED ORDER — HYDROCHLOROTHIAZIDE 25 MG PO TABS: 25.0000 mg | ORAL_TABLET | Freq: Every day | ORAL | 1 refills | Status: DC

## 2020-06-07 MED ORDER — OLOPATADINE HCL 0.1 % OP SOLN: 1.0000 [drp] | Freq: Two times a day (BID) | OPHTHALMIC | 12 refills | Status: DC

## 2020-06-07 NOTE — Patient Instructions (Addendum)
Great to see you today.  BP is so much better.  Continue HCTZ and amlodipine  Next appt physical with fasting labs.

## 2020-06-07 NOTE — Progress Notes (Signed)
Patient ID: Kristen Rosario, female  DOB: [birth year], 49 y.o.   MRN: [id number] Patient Care Team    Relationship Specialty Notifications Start End  Natalia Leatherwood, DO PCP - General Family Medicine  03/15/20     Chief Complaint  Patient presents with  . Follow-up    [facility]    Subjective: Kristen Rosario is a 49 y.o.  female present for [facility] Hypertension:  Pt reports compliance with hctz 25 mg and amlodipine 2.5 mg QD started after last visit.. She has a h/o HTN and had been on HCTZ and losartan at one time. She felt she was having a rash to losartan and had stopped. Patient denies chest pain, shortness of breath, dizziness or lower extremity edema.  She has changed her diet and trying to eat lower sodium and exercise. [place].   Depression screen [facility] 2/9 06/07/2020 03/15/2020  Decreased Interest 0 0  Down, Depressed, Hopeless 0 0  PHQ - 2 Score 0 0  Altered sleeping - 1  Tired, decreased energy - 0  Change in appetite - 0  Feeling bad or failure about yourself  - 0  Trouble concentrating - 0  Moving slowly or fidgety/restless - 0  Suicidal thoughts - 0  PHQ-9 Score - 1  Difficult doing work/chores - Not difficult at all   No flowsheet data found.     No flowsheet data found. Immunization History  Administered Date(s) Administered  . Moderna SARS-COVID-2 Vaccination 05/22/2020    No exam data present  Past Medical History:  Diagnosis Date  . JSHFWYOV(785.8)    "maybe monthly" (12/19/2013)  . Hypertension    "dx'd; never took RX" (12/19/2013)  . Urticaria   . Vitreous hemorrhage of left eye (HCC)    Allergies  Allergen Reactions  . Losartan     rash  . Sulfur Itching   Past Surgical History:  Procedure Laterality Date  . MEMBRANE PEEL Left 12/19/2013   Procedure: MEMBRANE PEEL;  Surgeon: Sherrie George, MD;  Location: [facility] OR;  Service: Ophthalmology;  Laterality: Left;  . PARS PLANA VITRECTOMY Left 12/19/2013  . PARS PLANA  VITRECTOMY Left 12/19/2013   Procedure: PARS PLANA VITRECTOMY WITH 25 GAUGE; ENDOLASER;FLUID/AIR EXCHANGE;  Surgeon: Sherrie George, MD;  Location: [facility] OR;  Service: Ophthalmology;  Laterality: Left;  . WISDOM TOOTH EXTRACTION  ~ 2001   Family History  Problem Relation Age of Onset  . Allergic rhinitis Mother   . Hypertension Mother   . Allergic rhinitis Father   . Asthma Sister   . Hypertension Sister   . Asthma Brother   . Hypertension Brother    Social History   Social History Narrative   Marital status/children/pets: single   Education/employment: some college. 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 If you have any questions, ask your nurse or doctor.        amLODipine 2.5 MG tablet Commonly known as: NORVASC Take 1 tablet (2.5 mg total) by mouth daily.   cetirizine 10 MG tablet  Commonly known as: ZYRTEC Take 1 tablet (10 mg total) by mouth daily.   diphenhydrAMINE 25 MG tablet Commonly known as: Benadryl Take 1 tablet (25 mg total) by mouth every 6 (six) hours as needed.   EpiPen 2-Pak 0.3 mg/0.3 mL Soaj injection Generic drug: EPINEPHrine INJECT 0.3 MILLILITER BY INTRAMUSCULAR ROUTE ONCE AS NEEDED FOR ANAPHYLAXIS   famotidine 20 MG tablet Commonly known as: PEPCID Take 1 tablet (20 mg total) by mouth 2 (two) times daily.   hydrochlorothiazide 25 MG tablet Commonly known as: HYDRODIURIL Take 1 tablet (25 mg total) by mouth daily.   montelukast 10 MG tablet Commonly known as: Singulair Take 1 tablet (10 mg total) by mouth at bedtime.   olopatadine 0.1 % ophthalmic solution Commonly known as: GNP Olopatadine HCl Place 1 drop into both eyes 2 (two) times daily. Started by: Kristen Pacini, DO   sodium  chloride 0.65 % Soln nasal spray Commonly known as: OCEAN Place 1 spray into both nostrils as needed for congestion.       All past medical history, surgical history, allergies, family history, immunizations andmedications were updated in the EMR today and reviewed under the history and medication portions of their EMR.     No results found.   ROS: 14 pt review of systems performed and negative (unless mentioned in an HPI)  Objective: BP 106/68   Pulse 64   Temp 98 F (36.7 C) (Oral)   Ht 5\' 4"  (1.626 m)   Wt 155 lb (70.3 kg)   SpO2 100%   BMI 26.61 kg/m  Gen: Afebrile. No acute distress.  HENT: AT. [place].  Eyes:Pupils Equal Round Reactive to light, Extraocular movements intact,  Conjunctiva without redness, discharge or icterus. Neck/lymp/endocrine: Supple,no lymphadenopathy, no thyromegaly CV: RRR no murmur, no edema, +2/4 P posterior tibialis pulses Chest: CTAB, no wheeze or crackles Skin: no rashes, purpura or petechiae.  Neuro:  Normal gait. PERLA. EOMi. Alert. Oriented x3  Psych: Normal affect, dress and demeanor. Normal speech. Normal thought content and judgment.  Assessment/plan: Kristen Rosario is a 49 y.o. female present for  Essential hypertension Much improved.  Continue HCTZ 25 mg QD continue amlodipine 2.5 mg QD.  Low salt diet Exercise routinely.  Labs UTD Next appt 5.5 months (CPE    Return in about 24 weeks (around 11/22/2020) for CPE (30 min).  No orders of the defined types were placed in this encounter.  Meds ordered this encounter  Medications  . hydrochlorothiazide (HYDRODIURIL) 25 MG tablet    Sig: Take 1 tablet (25 mg total) by mouth daily.    Dispense:  90 tablet    Refill:  1  . amLODipine (NORVASC) 2.5 MG tablet    Sig: Take 1 tablet (2.5 mg total) by mouth daily.    Dispense:  90 tablet    Refill:  1  . olopatadine (GNP OLOPATADINE HCL) 0.1 % ophthalmic solution    Sig: Place 1 drop into both eyes 2 (two) times daily.    Dispense:   5 mL    Refill:  12   Referral Orders  No referral(s) requested today     Note is dictated utilizing voice recognition software. Although note has been proof read prior to signing, occasional typographical errors still can be missed. If any questions arise, please do not hesitate to call for verification.  Electronically signed by: 01/22/2021, DO [facility] [place]

## 2020-09-11 ENCOUNTER — Other Ambulatory Visit: Payer: Self-pay | Admitting: Allergy

## 2020-12-09 ENCOUNTER — Other Ambulatory Visit: Payer: Self-pay | Admitting: Allergy

## 2020-12-10 ENCOUNTER — Other Ambulatory Visit: Payer: Self-pay

## 2020-12-10 MED ORDER — AMLODIPINE BESYLATE 2.5 MG PO TABS: 2.5000 mg | ORAL_TABLET | Freq: Every day | ORAL | 0 refills | Status: DC

## 2020-12-10 MED ORDER — HYDROCHLOROTHIAZIDE 25 MG PO TABS: 25.0000 mg | ORAL_TABLET | Freq: Every day | ORAL | 0 refills | Status: DC

## 2020-12-11 ENCOUNTER — Other Ambulatory Visit: Payer: Self-pay

## 2020-12-11 MED ORDER — HYDROCHLOROTHIAZIDE 25 MG PO TABS: 25.0000 mg | ORAL_TABLET | Freq: Every day | ORAL | 0 refills | Status: DC

## 2020-12-11 MED ORDER — AMLODIPINE BESYLATE 2.5 MG PO TABS: 2.5000 mg | ORAL_TABLET | Freq: Every day | ORAL | 0 refills | Status: DC

## 2020-12-23 ENCOUNTER — Other Ambulatory Visit: Payer: Self-pay | Admitting: Allergy

## 2021-01-28 ENCOUNTER — Other Ambulatory Visit: Payer: Self-pay | Admitting: Family Medicine

## 2021-01-28 ENCOUNTER — Other Ambulatory Visit: Payer: Self-pay | Admitting: Allergy

## 2021-02-16 NOTE — Patient Instructions (Signed)
Chronic hives    - Repeat cbc with diff due decrease in WBC's and lymphocyte count from last office visit.  We will call you with results once they are     - resume high dose antihistamine regimen: Zyrtec 10 mg at night and Claritin 10 mg in the morning. Decrease famotidine (Pepcid) 20mg  twice a day. Continue  Singulair (montelukast) 10 mg at night   - if high-dose antihistamine regimen is not effective enough then would recommend Xolair monthly injections to better hive control. Information given on Xolair.  Tammy our Biologics coordinator will be in contact with you  Food allergy    - Avoid shellfish and fish. In case of an allergic reaction, give Benadryl 4 teaspoonfuls every 4 hours, and if life-threatening symptoms occur, inject with AuviQ 0.3 mg.  Environmental allergy (2018 skin test positive to dust mites)    - continue avoidance measures directed towards dust mites    - take daily antihistamine (Zyrtec or Allegra) as above    - unable to use medicated nose sprays    - recommend performing nasal saline rinses and using nasal saline spray to help flush out the nose/sinuses  High blood pressure  -Schedule a follow up with your primary care physician to discuss your elevated blood pressure  Shortness of breath May use albuterol 2 puffs every 4-6 hours as needed for cough, wheeze, tightness in chest, or shortness of breath.  Also may use 2 puffs 5 to 15 minutes prior to strenuous activity     Follow-up in  1 month or sooner if needed

## 2021-02-17 ENCOUNTER — Encounter: Payer: Self-pay | Admitting: Family

## 2021-02-17 ENCOUNTER — Ambulatory Visit: Payer: Federal, State, Local not specified - PPO | Admitting: Family

## 2021-02-17 ENCOUNTER — Other Ambulatory Visit: Payer: Self-pay

## 2021-02-17 VITALS — BP 140/92 | HR 76 | Temp 98.1°F | Resp 18 | Ht 65.0 in | Wt 165.2 lb

## 2021-02-17 DIAGNOSIS — I10 Essential (primary) hypertension: Secondary | ICD-10-CM

## 2021-02-17 DIAGNOSIS — R0602 Shortness of breath: Secondary | ICD-10-CM | POA: Diagnosis not present

## 2021-02-17 DIAGNOSIS — L508 Other urticaria: Secondary | ICD-10-CM

## 2021-02-17 DIAGNOSIS — R899 Unspecified abnormal finding in specimens from other organs, systems and tissues: Secondary | ICD-10-CM | POA: Diagnosis not present

## 2021-02-17 DIAGNOSIS — T7800XD Anaphylactic reaction due to unspecified food, subsequent encounter: Secondary | ICD-10-CM

## 2021-02-17 DIAGNOSIS — J3089 Other allergic rhinitis: Secondary | ICD-10-CM | POA: Diagnosis not present

## 2021-02-17 MED ORDER — EPINEPHRINE 0.3 MG/0.3ML IJ SOAJ: 0.3000 mg | INTRAMUSCULAR | 1 refills | Status: DC | PRN

## 2021-02-17 MED ORDER — ALBUTEROL SULFATE HFA 108 (90 BASE) MCG/ACT IN AERS: INHALATION_SPRAY | RESPIRATORY_TRACT | 1 refills | Status: DC

## 2021-02-17 MED ORDER — MONTELUKAST SODIUM 10 MG PO TABS: ORAL_TABLET | ORAL | 5 refills | Status: DC

## 2021-02-17 NOTE — Progress Notes (Signed)
[address] [place] [address] Dept: [phone]  FOLLOW UP NOTE  Patient ID: Kristen Rosario, female    DOB: 1971/05/30  Age: 50 y.o. MRN: [id number] Date of Office Visit: 02/17/2021  Assessment  Chief Complaint: Breathing Problem  HPI Kristen Rosario is a 50 year old female who presents today for an acute visit of shortness of breath.  She was last seen on February 21, 2020 by Dr. Delorse Lek for chronic urticaria, anaphylaxis due to food, allergic rhinitis, and high blood pressure.  She reports that approximately 3 to 4 days ago she sprayed bug spray before work and [place] and felt like she could not breathe.  She her lungs felt tight and it felt like she is not getting enough oxygen in.  She took her mask off and this helps but it took a couple hours for her to feel completely better.  She reports that this now Rosario occur occasionally with exertion.  She mentions that her brother and sister both have asthma.  She has never been diagnosed with asthma and does not have an albuterol inhaler or any other inhalers.  Chronic urticaria is reported as not well controlled with Claritin 10 mg in the morning, Zyrtec 10 mg at night, famotidine 20 mg 3-4 times a day, and Singulair at night.  She reports if she takes Zyrtec twice a day it causes her to feel sleepy.  Discussed how she was only supposed to be taking the famotidine twice a day and not 3-4 times a day.  She reports that she Rosario try to cut down on this medication, but if she takes it only twice a day she Rosario have hives.  She reports that her hives are occur every other day, but Rosario be every day if she does not take her medication.  She is interested in starting Xolair to help get better control of her hives.  She continues to avoid shellfish.  She reports sometime last year she ate oysters and she had hives and swelling for days.  She also mentions that when she eats fish that it Rosario cause hives  most of the time.  Her EpiPen is expired and is interested in getting a refill.  Allergic rhinitis is reported as moderately controlled with Claritin 10 mg in the morning, Zyrtec 10 mg at night, Singulair 10 mg at night, saline spray as needed, and sinus rinse as needed.  She is not able to tolerate medicated nasal sprays because it causes her to break out.  She reports nasal congestion and a little bit of clear rhinorrhea especially if she is wearing a mask.  She denies postnasal drip.  She reports that she has seen her primary care physician concerning her blood pressure and that her blood pressure Rosario usually be elevated at the doctor's office.   Drug Allergies:  Allergies  Allergen Reactions   Elemental Sulfur Itching   Losartan     rash    Review of Systems: Review of Systems  Constitutional:  Negative for chills and fever.  HENT:         Reports nasal congestion and a little bit of clear rhinorrhea especially if she wears a mask.  She denies postnasal drip.  Eyes:        Reports itchy watery eyes at times for which Clear Eyes helps  Respiratory:  Positive for cough and shortness of breath. Negative for wheezing.  Reports occasional dry cough that occurs not that often.  She associates this cough with allergies.  She also reports shortness of breath with exertion on occasion  Cardiovascular:  Negative for chest pain and palpitations.  Gastrointestinal:  Positive for heartburn.       Reports occasional heartburn for which her medication she takes helps  Genitourinary:  Negative for dysuria.  Skin:  Negative for itching and rash.  Neurological:  Negative for headaches.  Endo/Heme/Allergies:  Positive for environmental allergies.    Physical Exam: BP (!) 140/92 (BP Location: Left Arm, Patient Position: Sitting, Cuff Size: Normal)   Pulse 76   Temp 98.1 F (36.7 C) (Temporal)   Resp 18   Ht 5\' 5"  (1.651 m)   Wt 165 lb 3.2 oz (74.9 kg)   SpO2 100%   BMI 27.49 kg/m     Physical Exam Constitutional:      Appearance: Normal appearance.  HENT:     Head: Normocephalic and atraumatic.     Comments: Pharynx normal, eyes normal, ears: Unable to visualize right tympanic membrane due to cerumen.  Left ear normal.  Nose: Bilateral lower turbinate moderately edematous with clear drainage noted    Right Ear: Ear canal and external ear normal.     Left Ear: Tympanic membrane, ear canal and external ear normal.     Mouth/Throat:     Mouth: Mucous membranes are moist.     Pharynx: Oropharynx is clear.  Eyes:     Conjunctiva/sclera: Conjunctivae normal.  Cardiovascular:     Rate and Rhythm: Normal rate and regular rhythm.     Pulses: Normal pulses.     Heart sounds: Normal heart sounds.  Pulmonary:     Effort: Pulmonary effort is normal.     Breath sounds: Normal breath sounds.     Comments: Lungs clear to auscultation Musculoskeletal:     Cervical back: Neck supple.  Skin:    General: Skin is warm.     Comments: No urticarial lesions or rash noted  Neurological:     Mental Status: She is alert and oriented to person, place, and time.  Psychiatric:        Mood and Affect: Mood normal.        Behavior: Behavior normal.        Thought Content: Thought content normal.        Judgment: Judgment normal.    Diagnostics: FVC 2.69 L, FEV1 2.05 L.  Predicted FVC 2.98 L, predicted FEV1 2.39 L.  Spirometry indicates normal ventilatory function.  Post bronchodilator response shows FVC 2.63 L, FEV1 2.00 L.  Spirometry indicates normal ventilatory function with no significant bronchodilator response.  Assessment and Plan: 1. Shortness of breath   2. Abnormal laboratory test result   3. Chronic urticaria   4. Allergy with anaphylaxis due to food, subsequent encounter   5. Non-seasonal allergic rhinitis due to other allergic trigger   6. Elevated blood pressure reading in office with diagnosis of hypertension     No orders of the defined types were placed in  this encounter.   Patient Instructions  Chronic hives    - Repeat cbc with diff due decrease in WBC's and lymphocyte count from last office visit.  We Rosario call you with results once they are     - resume high dose antihistamine regimen: Zyrtec 10 mg at night and Claritin 10 mg in the morning. Decrease famotidine (Pepcid) 20mg  twice a day. Continue  Singulair (montelukast) 10 mg  at night   - if high-dose antihistamine regimen is not effective enough then would recommend Xolair monthly injections to better hive control. Information given on Xolair.  Tammy our Biologics coordinator Rosario be in contact with you  Food allergy    - Avoid shellfish and fish. In case of an allergic reaction, give Benadryl 4 teaspoonfuls every 4 hours, and if life-threatening symptoms occur, inject with AuviQ 0.3 mg.  Environmental allergy (2018 skin test positive to dust mites)    - continue avoidance measures directed towards dust mites    - take daily antihistamine (Zyrtec or Allegra) as above    - unable to use medicated nose sprays    - recommend performing nasal saline rinses and using nasal saline spray to help flush out the nose/sinuses  High blood pressure  -Schedule a follow up with your primary care physician to discuss your elevated blood pressure  Shortness of breath May use albuterol 2 puffs every 4-6 hours as needed for cough, wheeze, tightness in chest, or shortness of breath.  Also may use 2 puffs 5 to 15 minutes prior to strenuous activity     Follow-up in  1 month or sooner if needed No follow-ups on file.    Thank you for the opportunity to care for this patient.  Please do not hesitate to contact me with questions.  Nehemiah Settle, FNP [facility]

## 2021-02-18 ENCOUNTER — Telehealth: Payer: Self-pay | Admitting: *Deleted

## 2021-02-18 LAB — CBC WITH DIFFERENTIAL
Basophils Absolute: 0 10*3/uL (ref 0.0–0.2)
Basos: 1 %
EOS (ABSOLUTE): 0.1 10*3/uL (ref 0.0–0.4)
Eos: 3 %
Hematocrit: 35.6 % (ref 34.0–46.6)
Hemoglobin: 11.4 g/dL (ref 11.1–15.9)
Immature Grans (Abs): 0 10*3/uL (ref 0.0–0.1)
Immature Granulocytes: 0 %
Lymphocytes Absolute: 0.6 10*3/uL — ABNORMAL LOW (ref 0.7–3.1)
Lymphs: 15 %
MCH: 27.1 pg (ref 26.6–33.0)
MCHC: 32 g/dL (ref 31.5–35.7)
MCV: 85 fL (ref 79–97)
Monocytes Absolute: 0.8 10*3/uL (ref 0.1–0.9)
Monocytes: 21 %
Neutrophils Absolute: 2.3 10*3/uL (ref 1.4–7.0)
Neutrophils: 60 %
RBC: 4.2 x10E6/uL (ref 3.77–5.28)
RDW: 14.5 % (ref 11.7–15.4)
WBC: 3.8 10*3/uL (ref 3.4–10.8)

## 2021-02-18 NOTE — Telephone Encounter (Signed)
-----   Message from Nehemiah Settle, FNP sent at 02/17/2021 11:15 AM EDT ----- Kristen Rosario is interested in starting Xolair injections for urticaria

## 2021-02-18 NOTE — Telephone Encounter (Signed)
Called patient and advised approval, copay card and submit to [employer].  Will reach out once delivery set to advise patient to make appt to start therapy

## 2021-02-18 NOTE — Progress Notes (Signed)
Thank you :)

## 2021-02-18 NOTE — Progress Notes (Signed)
Please let Kristen Rosario know that her WBC count returned to normal.

## 2021-03-03 ENCOUNTER — Ambulatory Visit: Payer: Federal, State, Local not specified - PPO

## 2021-03-05 ENCOUNTER — Other Ambulatory Visit: Payer: Self-pay

## 2021-03-07 ENCOUNTER — Other Ambulatory Visit: Payer: Self-pay

## 2021-03-18 ENCOUNTER — Telehealth: Payer: Self-pay

## 2021-03-18 MED ORDER — AMLODIPINE BESYLATE 2.5 MG PO TABS: 2.5000 mg | ORAL_TABLET | Freq: Every day | ORAL | 0 refills | Status: DC

## 2021-03-18 MED ORDER — HYDROCHLOROTHIAZIDE 25 MG PO TABS: 25.0000 mg | ORAL_TABLET | Freq: Every day | ORAL | 0 refills | Status: DC

## 2021-03-18 NOTE — Telephone Encounter (Signed)
Patient request refill on her blood pressure meds.  She is out of meds.  Patient was not aware she was due for a visit before refills could be approved.  Scheduled OV with Dr. Claiborne Billings on 03/24/21.  amLODipine (NORVASC) 2.5 MG tablet [[id number]]   hydrochlorothiazide (HYDRODIURIL) 25 MG tablet [[id number]]    [id number] - [place], [place] - [address]

## 2021-03-18 NOTE — Telephone Encounter (Signed)
Rx sent 

## 2021-03-24 ENCOUNTER — Ambulatory Visit: Payer: Federal, State, Local not specified - PPO | Admitting: Family Medicine

## 2021-03-31 ENCOUNTER — Ambulatory Visit: Payer: Federal, State, Local not specified - PPO | Admitting: Family Medicine

## 2021-03-31 ENCOUNTER — Other Ambulatory Visit: Payer: Self-pay

## 2021-03-31 ENCOUNTER — Encounter: Payer: Self-pay | Admitting: Family Medicine

## 2021-03-31 VITALS — BP 124/81 | HR 76 | Temp 98.3°F | Ht 65.0 in | Wt 159.0 lb

## 2021-03-31 DIAGNOSIS — I10 Essential (primary) hypertension: Secondary | ICD-10-CM | POA: Diagnosis not present

## 2021-03-31 MED ORDER — AMLODIPINE BESYLATE 2.5 MG PO TABS: 2.5000 mg | ORAL_TABLET | Freq: Every day | ORAL | 1 refills | Status: DC

## 2021-03-31 MED ORDER — HYDROCHLOROTHIAZIDE 25 MG PO TABS: 25.0000 mg | ORAL_TABLET | Freq: Every day | ORAL | 1 refills | Status: DC

## 2021-03-31 NOTE — Progress Notes (Signed)
Patient ID: Kristen Rosario, female  DOB: Mar 11, 1971, 50 y.o.   MRN: [id number] Patient Care Team    Relationship Specialty Notifications Start End  Natalia Leatherwood, DO PCP - General Family Medicine  03/15/20     Chief Complaint  Patient presents with   Hypertension    CMC; pt is not fasting    Subjective: Kristen Rosario is a 50 y.o.  female present for [facility] Hypertension:  Pt reports compliance with hctz 25 mg and amlodipine 2.5 mg QD started after last visit.. She has a h/o HTN and had been on HCTZ and losartan at one time. She felt she was having a rash to losartan and had stopped. Patient denies chest pain, shortness of breath, dizziness or lower extremity edema.  She has changed her diet and trying to eat lower sodium and exercise. [place]. She has lost weight- 6lbs.   Depression screen [facility] 2/9 06/07/2020 03/15/2020  Decreased Interest 0 0  Down, Depressed, Hopeless 0 0  PHQ - 2 Score 0 0  Altered sleeping - 1  Tired, decreased energy - 0  Change in appetite - 0  Feeling bad or failure about yourself  - 0  Trouble concentrating - 0  Moving slowly or fidgety/restless - 0  Suicidal thoughts - 0  PHQ-9 Score - 1  Difficult doing work/chores - Not difficult at all   No flowsheet data found.     No flowsheet data found. Immunization History  Administered Date(s) Administered   [employer] Vaccination 05/24/2020, 06/21/2020    No results found.  Past Medical History:  Diagnosis Date   Headache(784.0)    "maybe monthly" (12/19/2013)   Hypertension    "dx'd; never took RX" (12/19/2013)   Urticaria    Vitreous hemorrhage of left eye (HCC)    Allergies  Allergen Reactions   Elemental Sulfur Itching   Losartan     rash   Past Surgical History:  Procedure Laterality Date   MEMBRANE PEEL Left 12/19/2013   Procedure: MEMBRANE PEEL;  Surgeon: Sherrie George, MD;  Location: [facility] OR;  Service: Ophthalmology;  Laterality:  Left;   PARS PLANA VITRECTOMY Left 12/19/2013   PARS PLANA VITRECTOMY Left 12/19/2013   Procedure: PARS PLANA VITRECTOMY WITH 25 GAUGE; ENDOLASER;FLUID/AIR EXCHANGE;  Surgeon: Sherrie George, MD;  Location[facility] OR;  Service: Ophthalmology;  Laterality: Left;   WISDOM TOOTH EXTRACTION  ~ 2001   Family History  Problem Relation Age of Onset   Allergic rhinitis Mother    Hypertension Mother    Allergic rhinitis Father    Asthma Sister    Hypertension Sister    Asthma Brother    Hypertension Brother    Social History   Social History Narrative   Marital status/children/pets: single   Education/employment: some college. [employer] developer:      -smoke alarm in the home:Yes     - wears seatbelt: Yes     - Feels safe in their relationships: Yes    Allergies as of 03/31/2021       Reactions   Elemental Sulfur Itching   Losartan    rash        Medication List        Accurate as of March 31, 2021  1:25 PM. If you have any questions, ask your nurse or doctor.          albuterol 108 (90 Base) MCG/ACT inhaler  Commonly known as: VENTOLIN HFA Inhale 2 puffs every 4-6 hours as needed for cough, wheeze, tightness in chest, or shortness of breath.   amLODipine 2.5 MG tablet Commonly known as: NORVASC Take 1 tablet (2.5 mg total) by mouth daily.   cetirizine 10 MG tablet Commonly known as: ZYRTEC Take 1 tablet (10 mg total) by mouth daily.   diphenhydrAMINE 25 MG tablet Commonly known as: Benadryl Take 1 tablet (25 mg total) by mouth every 6 (six) hours as needed.   EPINEPHrine 0.3 mg/0.3 mL Soaj injection Commonly known as: Auvi-Q Inject 0.3 mg into the muscle as needed for anaphylaxis.   famotidine 20 MG tablet Commonly known as: PEPCID Take 1 tablet (20 mg total) by mouth 2 (two) times daily.   hydrochlorothiazide 25 MG tablet Commonly known as: HYDRODIURIL Take 1 tablet (25 mg total) by mouth daily.   montelukast 10 MG tablet Commonly known as:  SINGULAIR TAKE 1 TABLET BY MOUTH EVERYDAY AT BEDTIME   olopatadine 0.1 % ophthalmic solution Commonly known as: GNP Olopatadine HCl Place 1 drop into both eyes 2 (two) times daily.   sodium chloride 0.65 % Soln nasal spray Commonly known as: OCEAN Place 1 spray into both nostrils as needed for congestion.   Xolair 150 MG/ML prefilled syringe Generic drug: omalizumab Inject into the skin.        All past medical history, surgical history, allergies, family history, immunizations andmedications were updated in the EMR today and reviewed under the history and medication portions of their EMR.     No results found.   ROS: 14 pt review of systems performed and negative (unless mentioned in an HPI)  Objective: BP 124/81   Pulse 76   Temp 98.3 F (36.8 C) (Oral)   Ht 5\' 5"  (1.651 m)   Wt 159 lb (72.1 kg)   SpO2 100%   BMI 26.46 kg/m  Gen: Afebrile. No acute distress. Nontoxic.  HENT: AT. [place].  Eyes:Pupils Equal Round Reactive to light, Extraocular movements intact,  Conjunctiva without redness, discharge or icterus. CV: RRR no murmur, no edema Chest: CTAB, no wheeze or crackles Neuro: Normal gait. PERLA. EOMi. Alert. Oriented x3 Psych: Normal affect, dress and demeanor. Normal speech. Normal thought content and judgment.   Assessment/plan: DIERRA RIESGO is a 50 y.o. female present for  Essential hypertension Stable.  Continue HCTZ 25 mg QD Continue amlodipine 2.5 mg QD.  Low salt diet Exercise routinely.  Next appt 5.5 months as cpe and fasting- labs are due. Pt aware.    Return for CPE (30 min), CMC (30 min).  No orders of the defined types were placed in this encounter.  Meds ordered this encounter  Medications   amLODipine (NORVASC) 2.5 MG tablet    Sig: Take 1 tablet (2.5 mg total) by mouth daily.    Dispense:  90 tablet    Refill:  1   hydrochlorothiazide (HYDRODIURIL) 25 MG tablet    Sig: Take 1 tablet (25 mg total) by mouth daily.    Dispense:  90  tablet    Refill:  1    Referral Orders  No referral(s) requested today     Note is dictated utilizing voice recognition software. Although note has been proof read prior to signing, occasional typographical errors still can be missed. If any questions arise, please do not hesitate to call for verification.  Electronically signed by: 42, DO [facility]

## 2021-03-31 NOTE — Patient Instructions (Signed)
Next appt mid January- schedule as your physical and come fasting so we can get all your labs.    Great to see you today.  I have refilled the medication(s) we provide.   If labs were collected, we will inform you of lab results once received either by echart message or telephone call.   - echart message- for normal results that have been seen by the patient already.   - telephone call: abnormal results or if patient has not viewed results in their echart.

## 2021-04-20 ENCOUNTER — Other Ambulatory Visit: Payer: Self-pay | Admitting: Family

## 2021-04-21 NOTE — Telephone Encounter (Signed)
It looks like one was sent 2 months ago does she need another refill?

## 2021-04-22 NOTE — Telephone Encounter (Signed)
Lm for pt to call us back need to know if she truly needs a refill or if this is just a general request form pharmacy

## 2021-04-25 NOTE — Telephone Encounter (Signed)
Lm for pt to call us back about the refill

## 2021-04-25 NOTE — Telephone Encounter (Signed)
Thank you :)

## 2021-04-29 NOTE — Telephone Encounter (Signed)
Left a message to call us back about the albuterol and her last refill

## 2021-05-12 ENCOUNTER — Other Ambulatory Visit: Payer: Self-pay

## 2021-05-12 ENCOUNTER — Ambulatory Visit
Admission: EM | Admit: 2021-05-12 | Discharge: 2021-05-12 | Disposition: A | Discharge status: Home / Self Care | Payer: Federal, State, Local not specified - PPO | Attending: Emergency Medicine | Admitting: Emergency Medicine

## 2021-05-12 DIAGNOSIS — R058 Other specified cough: Secondary | ICD-10-CM

## 2021-05-12 DIAGNOSIS — Z91018 Allergy to other foods: Secondary | ICD-10-CM

## 2021-05-12 DIAGNOSIS — U071 COVID-19: Secondary | ICD-10-CM | POA: Diagnosis not present

## 2021-05-12 MED ORDER — EPINEPHRINE 0.3 MG/0.3ML IJ SOAJ: 0.3000 mg | INTRAMUSCULAR | 0 refills | Status: DC | PRN

## 2021-05-12 MED ORDER — FLUTICASONE PROPIONATE 50 MCG/ACT NA SUSP: 2.0000 | Freq: Every day | NASAL | 2 refills | Status: DC

## 2021-05-12 NOTE — Discharge Instructions (Addendum)
As we discussed, have provided you with a prior prescription for epinephrine.  Please be aggressive in getting this prescription filled for yourself as it may be lifesaving.  I have also provided you with a prescription for Flonase, I understand that you have used this in the past but sometimes it is cheaper with prescription.  Please began by using 1 spray twice daily and then after about a week decrease to once daily.  Please be sure to follow-up with your primary care should you have any worsening symptoms, such as increased cough, worsening sputum production, fever, acute shortness of breath.  I think the COVID liver oil and vitamin C are perfectly suitable treatments for the symptoms that you have at this time.  Thank you for coming to urgent care and it was a pleasure to meet you today.

## 2021-05-12 NOTE — ED Provider Notes (Signed)
[facility]    CSN: [id number] Arrival date & time: 05/12/21  1018      History   Chief Complaint Chief Complaint  Patient presents with   Nasal Congestion   Cough   Fatigue   covid 19    Positive dx 05/03/21    HPI Kristen Rosario is a 50 y.o. female.   Patient reports being diagnosed with COVID-19 on May 03, 2021.  Patient complains of lingering symptoms after COVID including fatigue, weakness, congestion, cough productive of sputum.  Patient states she has been taking Mucinex however due to the significant constellation of allergies to multiple foods and unknown substances, patient has not had significant rash.  Patient decided to switch to cod liver oil and vitamin C which she is taking at this time.  Patient states initially that her sputum was dark in color, metallic tasting but since starting cod liver oil has now become more clear and is easier to produce.  States nasal congestion is not new, does not feel it is worse than usual, has been prescribed Flonase for this in the past but is currently not using.  Patient denies chest pain prior to or during visit today, also denies shortness of breath.  The history is provided by the patient.   Past Medical History:  Diagnosis Date   Headache(784.0)    "maybe monthly" (12/19/2013)   Hypertension    "dx'd; never took RX" (12/19/2013)   Urticaria    Vitreous hemorrhage of left eye [facility])     Patient Active Problem List   Diagnosis Date Noted   Essential hypertension 03/15/2020   Vitreous hemorrhage of left eye (HCC) 12/19/2013    Past Surgical History:  Procedure Laterality Date   MEMBRANE PEEL Left 12/19/2013   Procedure: MEMBRANE PEEL;  Surgeon: Sherrie George, MD;  Location: [facility] OR;  Service: Ophthalmology;  Laterality: Left;   PARS PLANA VITRECTOMY Left 12/19/2013   PARS PLANA VITRECTOMY Left 12/19/2013   Procedure: PARS PLANA VITRECTOMY WITH 25 GAUGE; ENDOLASER;FLUID/AIR EXCHANGE;  Surgeon: Sherrie George, MD;  Location: [facility] OR;  Service: Ophthalmology;  Laterality: Left;   WISDOM TOOTH EXTRACTION  ~ 2001    OB History   No obstetric history on file.      Home Medications    Prior to Admission medications   Medication Sig Start Date End Date Taking? Authorizing Provider  EPINEPHrine (EPIPEN 2-PAK) 0.3 mg/0.3 mL IJ SOAJ injection Inject 0.3 mg into the muscle as needed for anaphylaxis. Repeat injection in 5 minutes if symptoms do not improve.  Call 911 after initial injection. 05/12/21  Yes Theadora Rama Scales, PA-C  fluticasone (FLONASE) 50 MCG/ACT nasal spray Place 2 sprays into both nostrils daily. 05/12/21 06/11/21 Yes Theadora Rama Scales, PA-C  albuterol (VENTOLIN HFA) 108 (90 Base) MCG/ACT inhaler Inhale 2 puffs every 4-6 hours as needed for cough, wheeze, tightness in chest, or shortness of breath. 02/17/21   Nehemiah Settle, FNP  amLODipine (NORVASC) 2.5 MG tablet Take 1 tablet (2.5 mg total) by mouth daily. 03/31/21   Kuneff, Renee A, DO  cetirizine (ZYRTEC) 10 MG tablet Take 1 tablet (10 mg total) by mouth daily. 12/18/16   Marcelyn Bruins, MD  diphenhydrAMINE (BENADRYL) 25 MG tablet Take 1 tablet (25 mg total) by mouth every 6 (six) hours as needed. 12/18/16   Marcelyn Bruins, MD  famotidine (PEPCID) 20 MG tablet Take 1 tablet (20 mg total) by mouth 2 (two) times daily. 02/22/20  Marcelyn Bruins, MD  hydrochlorothiazide (HYDRODIURIL) 25 MG tablet Take 1 tablet (25 mg total) by mouth daily. 03/31/21   Kuneff, Renee A, DO  montelukast (SINGULAIR) 10 MG tablet TAKE 1 TABLET BY MOUTH EVERYDAY AT BEDTIME 02/17/21   Nehemiah Settle, FNP  olopatadine (GNP OLOPATADINE HCL) 0.1 % ophthalmic solution Place 1 drop into both eyes 2 (two) times daily. 06/07/20   Kuneff, Renee A, DO  sodium chloride (OCEAN) 0.65 % SOLN nasal spray Place 1 spray into both nostrils as needed for congestion.    [provider]  Geoffry Paradise 150 MG/ML prefilled syringe Inject into  the skin. 02/18/21   [provider]    Family History Family History  Problem Relation Age of Onset   Allergic rhinitis Mother    Hypertension Mother    Allergic rhinitis Father    Asthma Sister    Hypertension Sister    Asthma Brother    Hypertension Brother     Social History Social History   Tobacco Use   Smoking status: Never   Smokeless tobacco: Never  Vaping Use   Vaping Use: Never used  Substance Use Topics   Alcohol use: Yes    Alcohol/week: 4.0 standard drinks    Types: 2 Glasses of wine, 2 Cans of beer per week    Comment: 12/19/2013 "weekends may drink 3 - 4 drinks, wine or beer or liquor"   Drug use: No     Allergies   Losartan, Shellfish allergy, and Sulfa antibiotics   Review of Systems Review of Systems Per HPI  Physical Exam Triage Vital Signs ED Triage Vitals [05/12/21 1112]  Enc Vitals Group     BP 138/84     Pulse Rate 70     Resp 16     Temp 98.2 F (36.8 C)     Temp Source Oral     SpO2 99 %     Weight      Height      Head Circumference      Peak Flow      Pain Score      Pain Loc      Pain Edu?      Excl. in GC?    No data found.  Updated Vital Signs BP 138/84 (BP Location: Right Arm)   Pulse 70   Temp 98.2 F (36.8 C) (Oral)   Resp 16   LMP 04/06/2021 (Approximate)   SpO2 99%   Visual Acuity Right Eye Distance:   Left Eye Distance:   Bilateral Distance:    Right Eye Near:   Left Eye Near:    Bilateral Near:     Physical Exam Vitals and nursing note reviewed.  Constitutional:      General: She is not in acute distress.    Appearance: Normal appearance. She is well-developed.  HENT:     Head: Normocephalic and atraumatic.     Right Ear: Tympanic membrane, ear canal and external ear normal.     Left Ear: Tympanic membrane, ear canal and external ear normal.     Nose: Congestion and rhinorrhea present.     Mouth/Throat:     Mouth: Mucous membranes are moist.     Pharynx: Oropharynx is clear.   Eyes:     Conjunctiva/sclera: Conjunctivae normal.     Pupils: Pupils are equal, round, and reactive to light.  Cardiovascular:     Rate and Rhythm: Normal rate and regular rhythm.     Pulses: Normal pulses.  Heart sounds: Normal heart sounds. No murmur heard. Pulmonary:     Effort: Pulmonary effort is normal. No respiratory distress.     Breath sounds: Normal breath sounds. No stridor. No wheezing, rhonchi or rales.  Chest:     Chest wall: No tenderness.  Musculoskeletal:        General: Normal range of motion.     Cervical back: Normal range of motion and neck supple.  Lymphadenopathy:     Cervical: No cervical adenopathy.  Skin:    General: Skin is warm and dry.  Neurological:     General: No focal deficit present.     Mental Status: She is alert and oriented to person, place, and time.  Psychiatric:        Mood and Affect: Mood normal.        Behavior: Behavior normal.     UC Treatments / Results  Labs (all labs ordered are listed, but only abnormal results are displayed) Labs Reviewed - No data to display  EKG   Radiology No results found.  Procedures Procedures (including critical care time)  Medications Ordered in UC Medications - No data to display  Initial Impression / Assessment and Plan / UC Course  I have reviewed the triage vital signs and the nursing notes.  Pertinent labs & imaging results that were available during my care of the patient were reviewed by me and considered in my medical decision making (see chart for details).     Physical exam today was unremarkable for any signs of acute superinfection.  Patient was advised to continue current treatment regimen as it seems to be effective.  Patient given strict instructions to actively pursue getting her EpiPen filled and delivered to her home so that she can carry it with her at all times.  Also strongly recommended that patient carry Benadryl on her person at all times as well.  Patient  states she has been taking Singulair, Zyrtec and famotidine regularly for her chronic allergies.  Flonase for her congestion and rhinorrhea as this may be the underlying cause of her cough that is productive of small amounts of clear sputum.    Patient states she has not actively pursued desensitization therapy due to not having very good insurance.  Patient was advised to at the very least reach out to allergy and immunology, have therapy ordered to best determine what her cost will be out-of-pocket.  Patient states that this time she feels that her lifestyle is limited due to her multiple food allergies that really started in her 30s for the most part.  Patient advised to follow-up with primary care to discuss these issues. Final diagnoses:  COVID-19 virus RNA detected  Post-viral cough syndrome  History of food anaphylaxis     Discharge Instructions      As we discussed, have provided you with a prior prescription for epinephrine.  Please be aggressive in getting this prescription filled for yourself as it may be lifesaving.  I have also provided you with a prescription for Flonase, I understand that you have used this in the past but sometimes it is cheaper with prescription.  Please began by using 1 spray twice daily and then after about a week decrease to once daily.  Please be sure to follow-up with your primary care should you have any worsening symptoms, such as increased cough, worsening sputum production, fever, acute shortness of breath.  I think the COVID liver oil and vitamin C are perfectly suitable treatments  for the symptoms that you have at this time.  Thank you for coming to urgent care and it was a pleasure to meet you today.     ED Prescriptions     Medication Sig Dispense Auth. Provider   EPINEPHrine (EPIPEN 2-PAK) 0.3 mg/0.3 mL IJ SOAJ injection Inject 0.3 mg into the muscle as needed for anaphylaxis. Repeat injection in 5 minutes if symptoms do not improve.  Call 911  after initial injection. 1 each Theadora Rama Scales, PA-C   fluticasone (FLONASE) 50 MCG/ACT nasal spray Place 2 sprays into both nostrils daily. 18 mL Theadora Rama Scales, PA-C      PDMP not reviewed this encounter.   Theadora Rama Scales, PA-C 05/12/21 1202

## 2021-05-12 NOTE — ED Triage Notes (Signed)
Pt reports having some residual SOB, chest congestion and fatigue from Covid, patient denies chest pain.

## 2021-07-13 ENCOUNTER — Other Ambulatory Visit: Payer: Self-pay | Admitting: Family

## 2021-07-14 NOTE — Telephone Encounter (Signed)
Ok to refill. Please have her schedule a follow up appointment.

## 2021-11-05 ENCOUNTER — Telehealth: Payer: Self-pay

## 2021-11-05 ENCOUNTER — Other Ambulatory Visit: Payer: Self-pay

## 2021-11-05 ENCOUNTER — Other Ambulatory Visit: Payer: Self-pay | Admitting: Family

## 2021-11-05 MED ORDER — HYDROCHLOROTHIAZIDE 25 MG PO TABS: 25.0000 mg | ORAL_TABLET | Freq: Every day | ORAL | 1 refills | Status: DC

## 2021-11-05 NOTE — Telephone Encounter (Signed)
Patient refill request.  Patient schedule appt with Dr. Claiborne Billings for  01/12/22.  That is the first available time she could get in to see provider because of her work commitments.  ? [facility] [address] ? ?hydrochlorothiazide (HYDRODIURIL) 25 MG tablet [[id number]]  ? ? ? ?

## 2021-11-06 NOTE — Telephone Encounter (Signed)
Rx sent 

## 2021-12-10 ENCOUNTER — Other Ambulatory Visit: Payer: Self-pay | Admitting: Family

## 2021-12-11 NOTE — Telephone Encounter (Signed)
Please advise 

## 2021-12-11 NOTE — Telephone Encounter (Signed)
Patient refill request.  She states she cannot get off work to come to an appt.  01/12/22 with Dr. Raoul Pitch. [facility] [address] ? ? ?hydrochlorothiazide (HYDRODIURIL) 25 MG tablet [id number]  ?

## 2021-12-12 NOTE — Telephone Encounter (Signed)
I am not certain I understand the request. ?Patient was provided with 30 days of HCTZ with 1 refill on 11/05/2021 as a courtesy to get her to her appointment date in May. ?She should have enough medicines prescribed to her already with a refill. ? ?We will need to see her in May in order to continue to treat her hypertension and provide her with prescriptions. ?

## 2021-12-12 NOTE — Telephone Encounter (Signed)
LVM for pt to CB regarding medication refill.  ?

## 2021-12-15 NOTE — Telephone Encounter (Signed)
Pt state that she has enough to last her to upcoming appt.  ?

## 2021-12-23 MED ORDER — HYDROCHLOROTHIAZIDE 25 MG PO TABS: 25.0000 mg | ORAL_TABLET | Freq: Every day | ORAL | 0 refills | Status: DC

## 2021-12-23 NOTE — Telephone Encounter (Signed)
[facility] Day - Client ?Nonclinical Telephone Record  ?AccessNurse? ?Client [facility] Day - Client ?Client Site [facility] - Day ?Provider Felix Pacini ?Contact Type Call ?Who Is Calling Patient / Member / Family / Caregiver ?Caller Name Tyreka Henneke ?Caller Phone Number [phone] ?Patient Name Kristen Rosario ?Patient DOB Apr 02, 1971 ?Call Type Message Only Information Provided ?Reason for Call Medication Question / Request ?Initial Comment [facility] and [facility] not [facility]. Caller states she has been out for two weeks. ?Disp. Time Disposition Final User ?12/23/2021 9:46:22 AM General Information Provided Yes Marlow Baars, Tyrechia ?Call Closed By: Ellis Parents ?Transaction Date/Time: 12/23/2021 9:42:12 AM (ET) ?

## 2021-12-23 NOTE — Addendum Note (Signed)
Addended by: Maxie Barb on: 12/23/2021 10:40 AM ? ? Modules accepted: Orders ? ?

## 2021-12-23 NOTE — Telephone Encounter (Signed)
Rx sent. Pt informed that she must keep this upcoming appt.  ?

## 2022-01-12 ENCOUNTER — Encounter: Payer: Self-pay | Admitting: Family Medicine

## 2022-01-12 ENCOUNTER — Ambulatory Visit: Payer: Federal, State, Local not specified - PPO | Admitting: Family Medicine

## 2022-01-12 VITALS — BP 125/73 | HR 70 | Temp 97.5°F | Ht 65.0 in | Wt 168.0 lb

## 2022-01-12 DIAGNOSIS — E663 Overweight: Secondary | ICD-10-CM | POA: Insufficient documentation

## 2022-01-12 DIAGNOSIS — I10 Essential (primary) hypertension: Secondary | ICD-10-CM | POA: Diagnosis not present

## 2022-01-12 LAB — COMPREHENSIVE METABOLIC PANEL
ALT: 11 U/L (ref 0–35)
AST: 15 U/L (ref 0–37)
Albumin: 4.5 g/dL (ref 3.5–5.2)
Alkaline Phosphatase: 57 U/L (ref 39–117)
BUN: 17 mg/dL (ref 6–23)
CO2: 31 mEq/L (ref 19–32)
Calcium: 9.8 mg/dL (ref 8.4–10.5)
Chloride: 101 mEq/L (ref 96–112)
Creatinine, Ser: 0.7 mg/dL (ref 0.40–1.20)
GFR: 99.31 mL/min (ref >60.00)
Glucose, Bld: 95 mg/dL (ref 70–99)
Potassium: 3.2 mEq/L — ABNORMAL LOW (ref 3.5–5.1)
Sodium: 139 mEq/L (ref 135–145)
Total Bilirubin: 0.7 mg/dL (ref 0.2–1.2)
Total Protein: 7.7 g/dL (ref 6.0–8.3)

## 2022-01-12 LAB — CBC
HCT: 39.2 % (ref 36.0–46.0)
Hemoglobin: 12.8 g/dL (ref 12.0–15.0)
MCHC: 32.7 g/dL (ref 30.0–36.0)
MCV: 87.8 fl (ref 78.0–100.0)
Platelets: 222 10*3/uL (ref 150.0–400.0)
RBC: 4.46 Mil/uL (ref 3.87–5.11)
RDW: 14.9 % (ref 11.5–15.5)
WBC: 3.3 10*3/uL — ABNORMAL LOW (ref 4.0–10.5)

## 2022-01-12 LAB — TSH: TSH: 1.2 u[IU]/mL (ref 0.35–5.50)

## 2022-01-12 MED ORDER — AMLODIPINE BESYLATE 2.5 MG PO TABS: 2.5000 mg | ORAL_TABLET | Freq: Every day | ORAL | 1 refills | Status: DC

## 2022-01-12 MED ORDER — HYDROCHLOROTHIAZIDE 25 MG PO TABS: 25.0000 mg | ORAL_TABLET | Freq: Every day | ORAL | 1 refills | Status: DC

## 2022-01-12 NOTE — Progress Notes (Signed)
Patient ID: Kristen Rosario, female  DOB: 1971/07/09, 51 y.o.   MRN: [id number] Patient Care Team    Relationship Specialty Notifications Start End  Ma Hillock, DO PCP - General Family Medicine  03/15/20     Chief Complaint  Patient presents with   Hypertension    [place]; pt is not fasting    Subjective: Kristen Rosario is a 51 y.o.  female present for [facility] Hypertension:  Pt reports compliance with hctz 25 mg and amlodipine 2.5 mg QD . She has a h/o HTN and had been on HCTZ and losartan at one time. She felt she was having a rash to losartan and had stopped. Patient denies chest pain, shortness of breath, dizziness or lower extremity edema.  She has changed her diet and trying to eat lower sodium and exercise. [place]      01/12/2022    9:53 AM 06/07/2020    1:35 PM 03/15/2020    1:38 PM  Depression screen PHQ 2/9  Decreased Interest 0 0 0  Down, Depressed, Hopeless 0 0 0  PHQ - 2 Score 0 0 0  Altered sleeping   1  Tired, decreased energy   0  Change in appetite   0  Feeling bad or failure about yourself    0  Trouble concentrating   0  Moving slowly or fidgety/restless   0  Suicidal thoughts   0  PHQ-9 Score   1  Difficult doing work/chores   Not difficult at all       View : No data to display.                 View : No data to display.         Immunization History  Administered Date(s) Administered   [employer] Vaccination 05/24/2020, 06/21/2020    No results found.  Past Medical History:  Diagnosis Date   Headache(784.0)    "maybe monthly" (12/19/2013)   Hypertension    "dx'd; never took RX" (12/19/2013)   Urticaria    Vitreous hemorrhage of left eye (HCC)    Allergies  Allergen Reactions   Losartan Anaphylaxis    rash   Shellfish Allergy Anaphylaxis   Sulfa Antibiotics Hives   Past Surgical History:  Procedure Laterality Date   MEMBRANE PEEL Left 12/19/2013   Procedure: MEMBRANE PEEL;   Surgeon: Hayden Pedro, MD;  Location: [facility];  Service: Ophthalmology;  Laterality: Left;   PARS PLANA VITRECTOMY Left 12/19/2013   PARS PLANA VITRECTOMY Left 12/19/2013   Procedure: PARS PLANA VITRECTOMY WITH 25 GAUGE; ENDOLASER;FLUID/AIR EXCHANGE;  Surgeon: Hayden Pedro, MD;  Location: [facility];  Service: Ophthalmology;  Laterality: Left;   WISDOM TOOTH EXTRACTION  ~ 2001   Family History  Problem Relation Age of Onset   Allergic rhinitis Mother    Hypertension Mother    Allergic rhinitis Father    Asthma Sister    Hypertension Sister    Asthma Brother    Hypertension Brother    Social History   Social History Narrative   Marital status/children/pets: single   Education/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 If you have any questions, ask your nurse or doctor.          STOP taking these medications    diphenhydrAMINE 25 MG tablet Commonly known as: Benadryl Stopped by: Howard Pouch, DO   EPINEPHrine 0.3 mg/0.3 mL Soaj injection Commonly known as: EpiPen 2-Pak Stopped by: Howard Pouch, DO       TAKE these medications    albuterol 108 (90 Base) MCG/ACT inhaler Commonly known as: Ventolin HFA Inhale 2 puffs into the lungs every 4 (four) hours as needed for wheezing or shortness of breath.   amLODipine 2.5 MG tablet Commonly known as: NORVASC Take 1 tablet (2.5 mg total) by mouth daily. What changed:  when to take this additional instructions   cetirizine 10 MG tablet Commonly known as: ZYRTEC Take 1 tablet (10 mg total) by mouth daily.   famotidine 20 MG tablet Commonly known as: PEPCID Take 1 tablet (20 mg total) by mouth 2  (two) times daily.   fluticasone 50 MCG/ACT nasal spray Commonly known as: FLONASE Place 2 sprays into both nostrils daily.   hydrochlorothiazide 25 MG tablet Commonly known as: HYDRODIURIL Take 1 tablet (25 mg total) by mouth daily.   montelukast 10 MG tablet Commonly known as: SINGULAIR TAKE 1 TABLET BY MOUTH EVERYDAY AT BEDTIME   olopatadine 0.1 % ophthalmic solution Commonly known as: GNP Olopatadine HCl Place 1 drop into both eyes 2 (two) times daily.   sodium chloride 0.65 % Soln nasal spray Commonly known as: OCEAN Place 1 spray into both nostrils as needed for congestion.   Xolair 150 MG/ML prefilled syringe Generic drug: omalizumab Inject into the skin.        All past medical history, surgical history, allergies, family history, immunizations andmedications were updated in the EMR today and reviewed under the history and medication portions of their EMR.     No results found.   ROS: 14 pt review of systems performed and negative (unless mentioned in an HPI)  Objective: BP 125/73   Pulse 70   Temp (!) 97.5 F (36.4 C) (Oral)   Ht _0  (1.651 m)   Wt 168 lb (76.2 kg)   SpO2 100%   BMI 27.96 kg/m  Physical Exam Vitals and nursing note reviewed.  Constitutional:      General: She is not in acute distress.    Appearance: Normal appearance. She is not ill-appearing, toxic-appearing or diaphoretic.  HENT:     Head: Normocephalic and atraumatic.  Eyes:     General: No scleral icterus.       Right eye: No discharge.        Left eye: No discharge.     Extraocular Movements: Extraocular movements intact.     Conjunctiva/sclera: Conjunctivae normal.     Pupils: Pupils are equal, round, and reactive to light.  Cardiovascular:     Rate and Rhythm: Normal rate and regular rhythm.     Heart sounds: No murmur heard. Pulmonary:     Effort: Pulmonary effort is normal. No respiratory distress.     Breath sounds: Normal breath sounds. No wheezing, rhonchi or  rales.  Musculoskeletal:     Cervical back: Neck supple. No tenderness.     Right lower leg: No edema.     Left lower leg:  No edema.  Lymphadenopathy:     Cervical: No cervical adenopathy.  Skin:    General: Skin is warm and dry.     Coloration: Skin is not jaundiced or pale.     Findings: No erythema or rash.  Neurological:     Mental Status: She is alert and oriented to person, place, and time. Mental status is at baseline.     Motor: No weakness.     Gait: Gait normal.  Psychiatric:        Mood and Affect: Mood normal.        Behavior: Behavior normal.        Thought Content: Thought content normal.        Judgment: Judgment normal.    Assessment/plan: CARLEENA MIRES is a 51 y.o. female present for  Essential hypertension/overweight Stable Continue HCTZ 25 mg QD Continue  amlodipine 2.5 mg QD.  Low salt diet Exercise routinely.  Labs due: cbc, cmp, tsh, lipid collected today   Return in 24 weeks (on 06/29/2022) for cpe (20 min).  Orders Placed This Encounter  Procedures   CBC   Comp Met (CMET)   TSH    Meds ordered this encounter  Medications   amLODipine (NORVASC) 2.5 MG tablet    Sig: Take 1 tablet (2.5 mg total) by mouth daily.    Dispense:  90 tablet    Refill:  1   hydrochlorothiazide (HYDRODIURIL) 25 MG tablet    Sig: Take 1 tablet (25 mg total) by mouth daily.    Dispense:  90 tablet    Refill:  1    Referral Orders  No referral(s) requested today   31 minutes spent providing patient counseling and care.    Note is dictated utilizing voice recognition software. Although note has been proof read prior to signing, occasional typographical errors still can be missed. If any questions arise, please do not hesitate to call for verification.  Electronically signed by: Howard Pouch, DO [place]

## 2022-01-12 NOTE — Patient Instructions (Addendum)
Return in 24 weeks (on 06/29/2022) for cpe (20 min).        Great to see you today.  I have refilled the medication(s) we provide.   If labs were collected, we will inform you of lab results once received either by echart message or telephone call.   - echart message- for normal results that have been seen by the patient already.   - telephone call: abnormal results or if patient has not viewed results in their echart.

## 2022-01-13 ENCOUNTER — Telehealth: Payer: Self-pay | Admitting: Family Medicine

## 2022-01-13 MED ORDER — POTASSIUM CHLORIDE CRYS ER 20 MEQ PO TBCR: EXTENDED_RELEASE_TABLET | ORAL | 1 refills | Status: DC

## 2022-01-13 NOTE — Telephone Encounter (Signed)
LVM for pt to CB regarding results.  

## 2022-01-13 NOTE — Telephone Encounter (Signed)
Pt returned Britts call. Please call her again.

## 2022-01-13 NOTE — Telephone Encounter (Signed)
   McKissick, Kendrick Fries R 36 minutes ago (2:07 PM)   YM Pt returned Britts call. Please call her again.

## 2022-01-13 NOTE — Telephone Encounter (Signed)
Please use other encounter °

## 2022-01-13 NOTE — Telephone Encounter (Signed)
Please inform patient: Her blood cell counts, thyroid function, liver function and kidney function are all normal. Her potassium is mildly low.  Low potassium is a potential side effect from use of HCTZ for blood pressure.  I have called in a potassium supplement for her to take as label reads.  For the first 3 days she will take twice daily to get her levels up into normal range, then she will take 1 tab  with her HCTZ.

## 2022-01-13 NOTE — Telephone Encounter (Signed)
Spoke with pt regarding labs and instructions.   

## 2022-02-12 ENCOUNTER — Ambulatory Visit (INDEPENDENT_AMBULATORY_CARE_PROVIDER_SITE_OTHER): Payer: Federal, State, Local not specified - PPO

## 2022-02-12 ENCOUNTER — Ambulatory Visit
Admission: EM | Admit: 2022-02-12 | Discharge: 2022-02-12 | Disposition: A | Discharge status: Home / Self Care | Payer: Federal, State, Local not specified - PPO | Attending: Emergency Medicine | Admitting: Emergency Medicine

## 2022-02-12 DIAGNOSIS — S93401A Sprain of unspecified ligament of right ankle, initial encounter: Secondary | ICD-10-CM | POA: Diagnosis not present

## 2022-02-12 DIAGNOSIS — I10 Essential (primary) hypertension: Secondary | ICD-10-CM | POA: Diagnosis not present

## 2022-02-12 DIAGNOSIS — M79671 Pain in right foot: Secondary | ICD-10-CM | POA: Diagnosis not present

## 2022-02-12 DIAGNOSIS — M7989 Other specified soft tissue disorders: Secondary | ICD-10-CM | POA: Diagnosis not present

## 2022-02-12 DIAGNOSIS — R600 Localized edema: Secondary | ICD-10-CM | POA: Diagnosis not present

## 2022-02-12 DIAGNOSIS — M25571 Pain in right ankle and joints of right foot: Secondary | ICD-10-CM

## 2022-02-12 DIAGNOSIS — M2011 Hallux valgus (acquired), right foot: Secondary | ICD-10-CM

## 2022-02-12 MED ORDER — DICLOFENAC SODIUM 1 % EX GEL: 4.0000 g | Freq: Four times a day (QID) | CUTANEOUS | 2 refills | Status: DC

## 2022-02-12 MED ORDER — IBUPROFEN 400 MG PO TABS: 400.0000 mg | ORAL_TABLET | Freq: Three times a day (TID) | ORAL | 0 refills | Status: DC | PRN

## 2022-02-12 MED ORDER — HYDRALAZINE HCL 25 MG PO TABS: 25.0000 mg | ORAL_TABLET | Freq: Three times a day (TID) | ORAL | 0 refills | Status: DC

## 2022-02-12 NOTE — ED Provider Notes (Signed)
[facility]    CSN: [id number] Arrival date & time: 02/12/22  1107    HISTORY   Chief Complaint  Patient presents with   Foot Pain   HPI Kristen Rosario is a 51 y.o. female. Pt c/o pain and swelling on top of her right foot that began a few days ago.  Patient denies known injury or trauma to her right foot.  Patient states she has tried taking ibuprofen, applying heat and applying ice without meaningful relief.  Patient states she is also noticed that her ankle is extremely swollen, much more so than the left ankle but states this is not unusual, states she is currently prescribed amlodipine for her blood pressure but only takes it a few times a week because it causes increased swelling in her lower extremities almost immediately.  Patient states that her primary care provider is aware of this.  The history is provided by the patient.   Past Medical History:  Diagnosis Date   Headache(784.0)    "maybe monthly" (12/19/2013)   Hypertension    "dx'd; never took RX" (12/19/2013)   Urticaria    Vitreous hemorrhage of left eye [facility])    Patient Active Problem List   Diagnosis Date Noted   Overweight (BMI 25.0-29.9) 01/12/2022   Essential hypertension 03/15/2020   Vitreous hemorrhage of left eye (HCC) 12/19/2013   Past Surgical History:  Procedure Laterality Date   MEMBRANE PEEL Left 12/19/2013   Procedure: MEMBRANE PEEL;  Surgeon: Sherrie George, MD;  Location: [facility] OR;  Service: Ophthalmology;  Laterality: Left;   PARS PLANA VITRECTOMY Left 12/19/2013   PARS PLANA VITRECTOMY Left 12/19/2013   Procedure: PARS PLANA VITRECTOMY WITH 25 GAUGE; ENDOLASER;FLUID/AIR EXCHANGE;  Surgeon: Sherrie George, MD;  Location: [facility] OR;  Service: Ophthalmology;  Laterality: Left;   WISDOM TOOTH EXTRACTION  ~ 2001   OB History   No obstetric history on file.    Home Medications    Prior to Admission medications   Medication Sig Start Date End Date Taking? Authorizing Provider  albuterol  (VENTOLIN HFA) 108 (90 Base) MCG/ACT inhaler Inhale 2 puffs into the lungs every 4 (four) hours as needed for wheezing or shortness of breath. 12/10/21   Nehemiah Settle, FNP  amLODipine (NORVASC) 2.5 MG tablet Take 1 tablet (2.5 mg total) by mouth daily. 01/12/22   Kuneff, Renee A, DO  cetirizine (ZYRTEC) 10 MG tablet Take 1 tablet (10 mg total) by mouth daily. 12/18/16   Marcelyn Bruins, MD  famotidine (PEPCID) 20 MG tablet Take 1 tablet (20 mg total) by mouth 2 (two) times daily. 02/22/20   Marcelyn Bruins, MD  fluticasone (FLONASE) 50 MCG/ACT nasal spray Place 2 sprays into both nostrils daily. 05/12/21 06/11/21  Theadora Rama Scales, PA-C  hydrochlorothiazide (HYDRODIURIL) 25 MG tablet Take 1 tablet (25 mg total) by mouth daily. 01/12/22   Kuneff, Renee A, DO  montelukast (SINGULAIR) 10 MG tablet TAKE 1 TABLET BY MOUTH EVERYDAY AT BEDTIME 02/17/21   Nehemiah Settle, FNP  olopatadine (GNP OLOPATADINE HCL) 0.1 % ophthalmic solution Place 1 drop into both eyes 2 (two) times daily. 06/07/20   Kuneff, Renee A, DO  potassium chloride SA (KLOR-CON M) 20 MEQ tablet 1 tab twice daily for 3 days, then 1 tab daily. 01/13/22   Kuneff, Renee A, DO  sodium chloride (OCEAN) 0.65 % SOLN nasal spray Place 1 spray into both nostrils as needed for congestion.    [provider]  Geoffry Paradise 150  MG/ML prefilled syringe Inject into the skin. 02/18/21   [provider]    Family History Family History  Problem Relation Age of Onset   Allergic rhinitis Mother    Hypertension Mother    Allergic rhinitis Father    Asthma Sister    Hypertension Sister    Asthma Brother    Hypertension Brother    Social History Social History   Tobacco Use   Smoking status: Never   Smokeless tobacco: Never  Vaping Use   Vaping Use: Never used  Substance Use Topics   Alcohol use: Yes    Alcohol/week: 4.0 standard drinks of alcohol    Types: 2 Glasses of wine, 2 Cans of beer per week    Comment:  12/19/2013 "weekends may drink 3 - 4 drinks, wine or beer or liquor"   Drug use: No   Allergies   Losartan, Shellfish allergy, and Sulfa antibiotics  Review of Systems Review of Systems Pertinent findings noted in history of present illness.   Physical Exam Triage Vital Signs ED Triage Vitals  Enc Vitals Group     BP 06/20/21 0827 (!) 147/82     Pulse Rate 06/20/21 0827 72     Resp 06/20/21 0827 18     Temp 06/20/21 0827 98.3 F (36.8 C)     Temp Source 06/20/21 0827 Oral     SpO2 06/20/21 0827 98 %     Weight --      Height --      Head Circumference --      Peak Flow --      Pain Score 06/20/21 0826 5     Pain Loc --      Pain Edu? --      Excl. in GC? --   No data found.  Updated Vital Signs BP 137/85 (BP Location: Left Arm)   Pulse (!) 58   Temp 97.6 F (36.4 C) (Oral)   Resp 18   SpO2 99%   Physical Exam Vitals and nursing note reviewed.  Constitutional:      General: She is not in acute distress.    Appearance: Normal appearance.  HENT:     Head: Normocephalic and atraumatic.  Eyes:     Pupils: Pupils are equal, round, and reactive to light.  Cardiovascular:     Rate and Rhythm: Normal rate and regular rhythm.  Pulmonary:     Effort: Pulmonary effort is normal.     Breath sounds: Normal breath sounds.  Musculoskeletal:        General: Normal range of motion.     Cervical back: Normal range of motion and neck supple.     Right ankle: Swelling (3+ pitting edema) present. No deformity, ecchymosis or lacerations. No tenderness (Right extensor hallucis brevis). Normal range of motion. Anterior drawer test negative. Normal pulse.     Left ankle: Swelling (1+ pitting edema) present. No deformity, ecchymosis or lacerations. No tenderness. Normal range of motion. Anterior drawer test negative. Normal pulse.     Left Achilles Tendon: Normal.     Right foot: Normal range of motion and normal capillary refill. Swelling (2+ pitting edema), bunion (Moderate) and  tenderness (Right extensor hallucis brevis) present. No deformity, Charcot foot, foot drop, prominent metatarsal heads, laceration, bony tenderness or crepitus. Normal pulse.     Left foot: Normal range of motion and normal capillary refill. Swelling (Trace pitting edema) and bunion (Small) present. No deformity, Charcot foot, foot drop, prominent metatarsal heads, laceration,  tenderness, bony tenderness or crepitus. Normal pulse.  Skin:    General: Skin is warm and dry.  Neurological:     General: No focal deficit present.     Mental Status: She is alert and oriented to person, place, and time. Mental status is at baseline.  Psychiatric:        Mood and Affect: Mood normal.        Behavior: Behavior normal.        Thought Content: Thought content normal.        Judgment: Judgment normal.     Visual Acuity Right Eye Distance:   Left Eye Distance:   Bilateral Distance:    Right Eye Near:   Left Eye Near:    Bilateral Near:     [facility] Couse / Diagnostics / Procedures:    EKG  Radiology DG Foot Complete Right  Result Date: 02/12/2022 CLINICAL DATA:  Right ankle sprain 8 months ago. Pain and swelling of right ankle and foot. EXAM: RIGHT FOOT COMPLETE - 3+ VIEW; RIGHT ANKLE - COMPLETE 3+ VIEW COMPARISON:  None Available. FINDINGS: Right ankle: The ankle mortise is symmetric and intact. Moderate diffuse soft tissue swelling. Joint spaces are preserved. No acute fracture is seen. No dislocation. Minimal enthesopathic change at the Achilles insertion on the calcaneus. Right foot: Mild hallux valgus. Minimal great toe metatarsophalangeal joint space narrowing and peripheral osteophytosis. Mild distal lateral anterior process of the calcaneus degenerative spurring at the calcaneocuboid articulation. No acute fracture is seen. No dislocation. Mild dorsal midfoot soft tissue swelling. IMPRESSION: 1. Moderate ankle and mild dorsal midfoot soft tissue swelling. 2. No acute fracture is seen. 3. Mild  hallux valgus with minimal great toe metatarsophalangeal joint osteoarthritis. Electronically Signed   By: Neita Garnet M.D.   On: 02/12/2022 12:02   DG Ankle Complete Right  Result Date: 02/12/2022 CLINICAL DATA:  Right ankle sprain 8 months ago. Pain and swelling of right ankle and foot. EXAM: RIGHT FOOT COMPLETE - 3+ VIEW; RIGHT ANKLE - COMPLETE 3+ VIEW COMPARISON:  None Available. FINDINGS: Right ankle: The ankle mortise is symmetric and intact. Moderate diffuse soft tissue swelling. Joint spaces are preserved. No acute fracture is seen. No dislocation. Minimal enthesopathic change at the Achilles insertion on the calcaneus. Right foot: Mild hallux valgus. Minimal great toe metatarsophalangeal joint space narrowing and peripheral osteophytosis. Mild distal lateral anterior process of the calcaneus degenerative spurring at the calcaneocuboid articulation. No acute fracture is seen. No dislocation. Mild dorsal midfoot soft tissue swelling. IMPRESSION: 1. Moderate ankle and mild dorsal midfoot soft tissue swelling. 2. No acute fracture is seen. 3. Mild hallux valgus with minimal great toe metatarsophalangeal joint osteoarthritis. Electronically Signed   By: Neita Garnet M.D.   On: 02/12/2022 12:02    Procedures Procedures (including critical care time)  UC Diagnoses / Final Clinical Impressions(s)   I have reviewed the triage vital signs and the nursing notes.  Pertinent labs & imaging results that were available during my care of the patient were reviewed by me and considered in my medical decision making (see chart for details).    Final diagnoses:  Bilateral lower extremity edema  Essential hypertension  Right foot pain  Right ankle pain, unspecified chronicity  Hallux valgus (acquired), right foot   Patient advised to continue hydrochlorothiazide for lower extremity edema, hold amlodipine and swapped out for hydralazine to see if this improves her swelling while maintaining her blood  pressure which is excellent today even though she has  not taken either medication yet this morning.  Discussed patient's x-ray findings with her, referral to podiatry, splinting her right great toe at night with a bunion splint, orthotics, rest, ice, NSAIDs both topical and p.o.  Return precautions advised.   ED Prescriptions     Medication Sig Dispense Auth. Provider   hydrALAZINE (APRESOLINE) 25 MG tablet Take 1 tablet (25 mg total) by mouth 3 (three) times daily. 90 tablet Theadora RamaMorgan, Cloteal Isaacson Scales, PA-C   ibuprofen (ADVIL) 400 MG tablet Take 1 tablet (400 mg total) by mouth every 8 (eight) hours as needed for up to 30 doses. 30 tablet Theadora RamaMorgan, Shaylin Blatt Scales, PA-C   diclofenac Sodium (VOLTAREN) 1 % GEL Apply 4 g topically 4 (four) times daily. Apply to affected areas 4 times daily as needed for pain. 100 g Theadora RamaMorgan, Lemuel Boodram Scales, PA-C      PDMP not reviewed this encounter.  Pending results:  Labs Reviewed - No data to display  Medications Ordered in UC: Medications - No data to display  Disposition Upon Discharge:  Condition: stable for discharge home Home: take medications as prescribed; routine discharge instructions as discussed; follow up as advised.  Patient presented with an acute illness with associated systemic symptoms and significant discomfort requiring urgent management. In my opinion, this is a condition that a prudent lay person (someone who possesses an average knowledge of health and medicine) may potentially expect to result in complications if not addressed urgently such as respiratory distress, impairment of bodily function or dysfunction of bodily organs.   Routine symptom specific, illness specific and/or disease specific instructions were discussed with the patient and/or caregiver at length.   As such, the patient has been evaluated and assessed, work-up was performed and treatment was provided in alignment with urgent care protocols and evidence based medicine.   Patient/parent/caregiver has been advised that the patient may require follow up for further testing and treatment if the symptoms continue in spite of treatment, as clinically indicated and appropriate.  If the patient was tested for COVID-19, Influenza and/or RSV, then the patient/parent/guardian was advised to isolate at home pending the results of his/her diagnostic coronavirus test and potentially longer if they're positive. I have also advised pt that if his/her COVID-19 test returns positive, it's recommended to self-isolate for at least 10 days after symptoms first appeared AND until fever-free for 24 hours without fever reducer AND other symptoms have improved or resolved. Discussed self-isolation recommendations as well as instructions for household member/close contacts as per the [facility] and [place] DHHS, and also gave patient the COVID packet with this information.  Patient/parent/caregiver has been [facility] in 3-5 days if no better; to PCP or the Emergency Department if new signs and symptoms develop, or if the current signs or symptoms continue to change or worsen for further workup, evaluation and treatment as clinically indicated and appropriate  The patient will follow up with their current PCP if and as advised. If the patient does not currently have a PCP we will assist them in obtaining one.   The patient may need specialty follow up if the symptoms continue, in spite of conservative treatment and management, for further workup, evaluation, consultation and treatment as clinically indicated and appropriate.   Patient/parent/caregiver verbalized understanding and agreement of plan as discussed.  All questions were addressed during visit.  Please see discharge instructions below for further details of plan.  Discharge Instructions:   Discharge Instructions  The x-ray of your right foot and ankle did not reveal any acute fracture.  Soft tissue swelling that  you are already aware of was also seen.  They further commented on your right great toe that has a condition called hallux valgus which is mild.  I included a diagram of the tendons of the foot which may help answer the question of why you are experiencing pain on the top of your foot because of believe this is related to the hallux valgus of your right great toe.  My recommendation would be to follow-up with a podiatrist to discuss orthotics which are customized shoe inserts that may correct this issue for you.  If your insurance does not require referral for you to see a specialist, I included the name of a few local podiatrist in the area that you are welcome to reach out to on your own.  If your insurance does require referral to see a specialist, please reach out to your primary care provider.  In the meantime, please continue ibuprofen and ice to your foot to help reduce the swelling.  I recommend taking ibuprofen 400 mg 3 times daily and icing your foot 3-4 times daily for 20 minutes each time for best results.  I have also provided you with a prescription for topical Voltaren gel which your insurance may or may not cover, it is fairly affordable off the shelf you are also welcome to download a good Rx coupon which may be the best price.  To help address the swelling and edema in your feet and ankles, I recommend that you continue taking hydrochlorothiazide every day, stop amlodipine for an entire week and begin hydralazine tablets.  The prescription states to take it 3 times daily but I believe taking it twice daily should be sufficient to evaluate for 2 possible outcomes, improved systolic blood pressure (top number) and decrease swelling in your lower extremities.    If neither of these occur, my next suggestion would be for you to switch your calcium channel blocker from amlodipine (Norvasc) to another calcium channel blocker that is known to cause less swelling called nicardipine (Cardene).  In  my opinion, if you are unable to tolerate taking amlodipine every day, which I believe you are not, then this is not the right medicine for you and your primary care provider need to find an alternative.  Thank you for visiting urgent care again.  It was a pleasure to see you, I am glad you are over COVID-19.  Thank you for the opportunity to participate your care.      This office note has been dictated using Teaching laboratory technician.  Unfortunately, and despite my best efforts, this method of dictation can sometimes lead to occasional typographical or grammatical errors.  I apologize in advance if this occurs.     Theadora Rama Scales, PA-C 02/12/22 [id number]

## 2022-02-12 NOTE — ED Triage Notes (Signed)
Pt c/o pain and swelling to her right foot (top of foot). The patient states she does not know what caused the pain/ swelling. Started: a few days ago Home interventions: Advil, heat/ ice

## 2022-02-12 NOTE — Discharge Instructions (Addendum)
The x-ray of your right foot and ankle did not reveal any acute fracture.  Soft tissue swelling that you are already aware of was also seen.  They further commented on your right great toe that has a condition called hallux valgus which is mild.  I included a diagram of the tendons of the foot which may help answer the question of why you are experiencing pain on the top of your foot because of believe this is related to the hallux valgus of your right great toe.  My recommendation would be to follow-up with a podiatrist to discuss orthotics which are customized shoe inserts that may correct this issue for you.  If your insurance does not require referral for you to see a specialist, I included the name of a few local podiatrist in the area that you are welcome to reach out to on your own.  If your insurance does require referral to see a specialist, please reach out to your primary care provider.  In the meantime, please continue ibuprofen and ice to your foot to help reduce the swelling.  I recommend taking ibuprofen 400 mg 3 times daily and icing your foot 3-4 times daily for 20 minutes each time for best results.  I have also provided you with a prescription for topical Voltaren gel which your insurance may or may not cover, it is fairly affordable off the shelf you are also welcome to download a good Rx coupon which may be the best price.  To help address the swelling and edema in your feet and ankles, I recommend that you continue taking hydrochlorothiazide every day, stop amlodipine for an entire week and begin hydralazine tablets.  The prescription states to take it 3 times daily but I believe taking it twice daily should be sufficient to evaluate for 2 possible outcomes, improved systolic blood pressure (top number) and decrease swelling in your lower extremities.    If neither of these occur, my next suggestion would be for you to switch your calcium channel blocker from amlodipine (Norvasc) to  another calcium channel blocker that is known to cause less swelling called nicardipine (Cardene).  In my opinion, if you are unable to tolerate taking amlodipine every day, which I believe you are not, then this is not the right medicine for you and your primary care provider need to find an alternative.  Thank you for visiting urgent care again.  It was a pleasure to see you, I am glad you are over COVID-19.  Thank you for the opportunity to participate your care.

## 2022-04-20 DIAGNOSIS — H25812 Combined forms of age-related cataract, left eye: Secondary | ICD-10-CM | POA: Diagnosis not present

## 2022-05-25 ENCOUNTER — Encounter: Payer: Self-pay | Admitting: Family Medicine

## 2022-05-25 ENCOUNTER — Ambulatory Visit (INDEPENDENT_AMBULATORY_CARE_PROVIDER_SITE_OTHER): Payer: Federal, State, Local not specified - PPO | Admitting: Family Medicine

## 2022-05-25 VITALS — BP 132/78 | HR 52 | Temp 97.7°F | Ht 64.5 in | Wt 166.0 lb

## 2022-05-25 DIAGNOSIS — Z0000 Encounter for general adult medical examination without abnormal findings: Secondary | ICD-10-CM

## 2022-05-25 DIAGNOSIS — Z23 Encounter for immunization: Secondary | ICD-10-CM

## 2022-05-25 DIAGNOSIS — E663 Overweight: Secondary | ICD-10-CM | POA: Diagnosis not present

## 2022-05-25 DIAGNOSIS — I10 Essential (primary) hypertension: Secondary | ICD-10-CM

## 2022-05-25 DIAGNOSIS — Z1211 Encounter for screening for malignant neoplasm of colon: Secondary | ICD-10-CM

## 2022-05-25 DIAGNOSIS — Z79899 Other long term (current) drug therapy: Secondary | ICD-10-CM | POA: Diagnosis not present

## 2022-05-25 DIAGNOSIS — Z1231 Encounter for screening mammogram for malignant neoplasm of breast: Secondary | ICD-10-CM

## 2022-05-25 LAB — CBC
HCT: 37.8 % (ref 36.0–46.0)
Hemoglobin: 12.5 g/dL (ref 12.0–15.0)
MCHC: 33.1 g/dL (ref 30.0–36.0)
MCV: 89.9 fl (ref 78.0–100.0)
Platelets: 216 10*3/uL (ref 150.0–400.0)
RBC: 4.2 Mil/uL (ref 3.87–5.11)
RDW: 13.7 % (ref 11.5–15.5)
WBC: 2.9 10*3/uL — ABNORMAL LOW (ref 4.0–10.5)

## 2022-05-25 LAB — COMPREHENSIVE METABOLIC PANEL
ALT: 10 U/L (ref 0–35)
AST: 14 U/L (ref 0–37)
Albumin: 4.4 g/dL (ref 3.5–5.2)
Alkaline Phosphatase: 53 U/L (ref 39–117)
BUN: 17 mg/dL (ref 6–23)
CO2: 30 mEq/L (ref 19–32)
Calcium: 9.8 mg/dL (ref 8.4–10.5)
Chloride: 100 mEq/L (ref 96–112)
Creatinine, Ser: 0.89 mg/dL (ref 0.40–1.20)
GFR: 74.26 mL/min (ref >60.00)
Glucose, Bld: 100 mg/dL — ABNORMAL HIGH (ref 70–99)
Potassium: 3.7 mEq/L (ref 3.5–5.1)
Sodium: 138 mEq/L (ref 135–145)
Total Bilirubin: 0.7 mg/dL (ref 0.2–1.2)
Total Protein: 7.5 g/dL (ref 6.0–8.3)

## 2022-05-25 LAB — LIPID PANEL
Cholesterol: 163 mg/dL (ref 0–200)
HDL: 58.8 mg/dL (ref >39.00)
LDL Cholesterol: 93 mg/dL (ref 0–99)
NonHDL: 104.49
Total CHOL/HDL Ratio: 3
Triglycerides: 56 mg/dL (ref 0.0–149.0)
VLDL: 11.2 mg/dL (ref 0.0–40.0)

## 2022-05-25 LAB — HEMOGLOBIN A1C: Hgb A1c MFr Bld: 5.8 % (ref 4.6–6.5)

## 2022-05-25 LAB — TSH: TSH: 1.7 u[IU]/mL (ref 0.35–5.50)

## 2022-05-25 MED ORDER — HYDROCHLOROTHIAZIDE 25 MG PO TABS: 25.0000 mg | ORAL_TABLET | Freq: Every day | ORAL | 1 refills | Status: DC

## 2022-05-25 MED ORDER — AMLODIPINE BESYLATE 2.5 MG PO TABS: 2.5000 mg | ORAL_TABLET | Freq: Every day | ORAL | 1 refills | Status: DC

## 2022-05-25 MED ORDER — POTASSIUM CHLORIDE CRYS ER 20 MEQ PO TBCR: EXTENDED_RELEASE_TABLET | ORAL | 1 refills | Status: DC

## 2022-05-25 NOTE — Patient Instructions (Addendum)
Return in about 24 weeks (around 11/09/2022).        Great to see you today.  I have refilled the medication(s) we provide.   If labs were collected, we will inform you of lab results once received either by echart message or telephone call.   - echart message- for normal results that have been seen by the patient already.   - telephone call: abnormal results or if patient has not viewed results in their echart.   Health Maintenance, Female Adopting a healthy lifestyle and getting preventive care are important in promoting health and wellness. Ask your health care provider about: The right schedule for you to have regular tests and exams. Things you can do on your own to prevent diseases and keep yourself healthy. What should I know about diet, weight, and exercise? Eat a healthy diet  Eat a diet that includes plenty of vegetables, fruits, low-fat dairy products, and lean protein. Do not eat a lot of foods that are high in solid fats, added sugars, or sodium. Maintain a healthy weight Body mass index (BMI) is used to identify weight problems. It estimates body fat based on height and weight. Your health care provider can help determine your BMI and help you achieve or maintain a healthy weight. Get regular exercise Get regular exercise. This is one of the most important things you can do for your health. Most adults should: Exercise for at least 150 minutes each week. The exercise should increase your heart rate and make you sweat (moderate-intensity exercise). Do strengthening exercises at least twice a week. This is in addition to the moderate-intensity exercise. Spend less time sitting. Even light physical activity can be beneficial. Watch cholesterol and blood lipids Have your blood tested for lipids and cholesterol at 53 years of age, then have this test every 5 years. Have your cholesterol levels checked more often if: Your lipid or cholesterol levels are high. You are older  than 53 years of age. You are at high risk for heart disease. What should I know about cancer screening? Depending on your health history and family history, you may need to have cancer screening at various ages. This may include screening for: Breast cancer. Cervical cancer. Colorectal cancer. Skin cancer. Lung cancer. What should I know about heart disease, diabetes, and high blood pressure? Blood pressure and heart disease High blood pressure causes heart disease and increases the risk of stroke. This is more likely to develop in people who have high blood pressure readings or are overweight. Have your blood pressure checked: Every 3-5 years if you are 88-15 years of age. Every year if you are 7 years old or older. Diabetes Have regular diabetes screenings. This checks your fasting blood sugar level. Have the screening done: Once every three years after age 85 if you are at a normal weight and have a low risk for diabetes. More often and at a younger age if you are overweight or have a high risk for diabetes. What should I know about preventing infection? Hepatitis B If you have a higher risk for hepatitis B, you should be screened for this virus. Talk with your health care provider to find out if you are at risk for hepatitis B infection. Hepatitis C Testing is recommended for: Everyone born from [birth year] through 1967. Anyone with known risk factors for hepatitis C. Sexually transmitted infections (STIs) Get screened for STIs, including gonorrhea and chlamydia, if: You are sexually active and are younger than 53 years  of age. You are older than 53 years of age and your health care provider tells you that you are at risk for this type of infection. Your sexual activity has changed since you were last screened, and you are at increased risk for chlamydia or gonorrhea. Ask your health care provider if you are at risk. Ask your health care provider about whether you are at high risk for  HIV. Your health care provider may recommend a prescription medicine to help prevent HIV infection. If you choose to take medicine to prevent HIV, you should first get tested for HIV. You should then be tested every 3 months for as long as you are taking the medicine. Pregnancy If you are about to stop having your period (premenopausal) and you may become pregnant, seek counseling before you get pregnant. Take 400 to 800 micrograms (mcg) of folic acid every day if you become pregnant. Ask for birth control (contraception) if you want to prevent pregnancy. Osteoporosis and menopause Osteoporosis is a disease in which the bones lose minerals and strength with aging. This can result in bone fractures. If you are 62 years old or older, or if you are at risk for osteoporosis and fractures, ask your health care provider if you should: Be screened for bone loss. Take a calcium or vitamin D supplement to lower your risk of fractures. Be given hormone replacement therapy (HRT) to treat symptoms of menopause. Follow these instructions at home: Alcohol use Do not drink alcohol if: Your health care provider tells you not to drink. You are pregnant, may be pregnant, or are planning to become pregnant. If you drink alcohol: Limit how much you have to: 0-1 drink a day. Know how much alcohol is in your drink. In the [place] one drink equals one 12 oz bottle of beer (355 mL), one 5 oz glass of wine (148 mL), or one 1 oz glass of hard liquor (44 mL). Lifestyle Do not use any products that contain nicotine or tobacco. These products include cigarettes, chewing tobacco, and vaping devices, such as e-cigarettes. If you need help quitting, ask your health care provider. Do not use street drugs. Do not share needles. Ask your health care provider for help if you need support or information about quitting drugs. General instructions Schedule regular health, dental, and eye exams. Stay current with your  vaccines. Tell your health care provider if: You often feel depressed. You have ever been abused or do not feel safe at home. Summary Adopting a healthy lifestyle and getting preventive care are important in promoting health and wellness. Follow your health care provider's instructions about healthy diet, exercising, and getting tested or screened for diseases. Follow your health care provider's instructions on monitoring your cholesterol and blood pressure. This information is not intended to replace advice given to you by your health care provider. Make sure you discuss any questions you have with your health care provider. Document Revised: 12/30/2020 Document Reviewed: 12/30/2020 Elsevier Patient Education  [place].

## 2022-05-25 NOTE — Progress Notes (Signed)
Patient ID: Kristen Rosario, female  DOB: Jul 15, 1971, 51 y.o.   MRN: [id number] Patient Care Team    Relationship Specialty Notifications Start End  Ma Hillock, DO PCP - General Family Medicine  03/15/20     Chief Complaint  Patient presents with   Annual Exam    Pt is fasting     Subjective:  Kristen Rosario is a 51 y.o.  Female  present for [place] All past medical history, surgical history, allergies, family history, immunizations, medications and social history were updated in the electronic medical record today. All recent labs, ED visits and hospitalizations within the last year were reviewed.  Health maintenance:  Colonoscopy: no fhx. Pt agreeable to cologuard testing. Mammogram: no fhx. Ordered today [facility] Cervical cancer screening: GYnecology  Immunizations: tdap updated today, Influenza declined (encouraged yearly), zostavax declined Infectious disease screening: HIV declined, Hep C declined DEXA: routine screen Assistive device: none Oxygen NKN:[id number] Patient has a Dental home. Hospitalizations/ED visits: reviewed  Hypertension:  Pt reports compliance with hctz 25 mg and sometimes she takes the amlodipine 2.5 mg QD . She has a h/o HTN and had been on HCTZ and losartan at one time. She felt she was having a rash to losartan and had stopped. Patient denies chest pain, shortness of breath, dizziness or lower extremity edema.  She has changed her diet and trying to eat lower sodium and exercise. She [place].     05/25/2022    9:02 AM 01/12/2022    9:53 AM 06/07/2020    1:35 PM 03/15/2020    1:38 PM  Depression screen PHQ 2/9  Decreased Interest 0 0 0 0  Down, Depressed, Hopeless 0 0 0 0  PHQ - 2 Score 0 0 0 0  Altered sleeping    1  Tired, decreased energy    0  Change in appetite    0  Feeling bad or failure about yourself     0  Trouble concentrating    0  Moving slowly or fidgety/restless    0  Suicidal thoughts    0   PHQ-9 Score    1  Difficult doing work/chores    Not difficult at all       No data to display           Immunization History  Administered Date(s) Administered   Moderna Sars-Covid-2 Vaccination 05/24/2020, 06/21/2020   Tdap 05/25/2022     Past Medical History:  Diagnosis Date   Headache(784.0)    "maybe monthly" (12/19/2013)   Hypertension    "dx'd; never took RX" (12/19/2013)   Urticaria    Vitreous hemorrhage of left eye (HCC)    Allergies  Allergen Reactions   Losartan Anaphylaxis    rash   Shellfish Allergy Anaphylaxis   Sulfa Antibiotics Hives   Past Surgical History:  Procedure Laterality Date   MEMBRANE PEEL Left 12/19/2013   Procedure: MEMBRANE PEEL;  Surgeon: Hayden Pedro, MD;  Location: [facility];  Service: Ophthalmology;  Laterality: Left;   PARS PLANA VITRECTOMY Left 12/19/2013   PARS PLANA VITRECTOMY Left 12/19/2013   Procedure: PARS PLANA VITRECTOMY WITH 25 GAUGE; ENDOLASER;FLUID/AIR EXCHANGE;  Surgeon: Hayden Pedro, MD;  Location: [facility];  Service: Ophthalmology;  Laterality: Left;   WISDOM TOOTH EXTRACTION  ~ 2001   Family History  Problem Relation Age of Onset   Allergic rhinitis Mother    Hypertension Mother    Allergic  rhinitis Father    Asthma Sister    Hypertension Sister    Asthma Brother    Hypertension Brother    Social History   Social History Narrative   Marital status/children/pets: single   Education/employment: some college. [employer] developer:      -smoke alarm in the home:Yes     - wears seatbelt: Yes     - Feels safe in their relationships: Yes    Allergies as of 05/25/2022       Reactions   Losartan Anaphylaxis   rash   Shellfish Allergy Anaphylaxis   Sulfa Antibiotics Hives        Medication List        Accurate as of May 25, 2022  9:49 AM. If you have any questions, ask your nurse or doctor.          STOP taking these medications    ibuprofen 400 MG tablet Commonly known as:  ADVIL Stopped by: Felix Pacini, DO       TAKE these medications    albuterol 108 (90 Base) MCG/ACT inhaler Commonly known as: Ventolin HFA Inhale 2 puffs into the lungs every 4 (four) hours as needed for wheezing or shortness of breath.   amLODipine 2.5 MG tablet Commonly known as: NORVASC Take 1 tablet (2.5 mg total) by mouth daily.   cetirizine 10 MG tablet Commonly known as: ZYRTEC Take 1 tablet (10 mg total) by mouth daily.   diclofenac Sodium 1 % Gel Commonly known as: VOLTAREN Apply 4 g topically 4 (four) times daily. Apply to affected areas 4 times daily as needed for pain.   famotidine 20 MG tablet Commonly known as: PEPCID Take 1 tablet (20 mg total) by mouth 2 (two) times daily.   fluticasone 50 MCG/ACT nasal spray Commonly known as: FLONASE Place 2 sprays into both nostrils daily.   hydrALAZINE 25 MG tablet Commonly known as: APRESOLINE Take 1 tablet (25 mg total) by mouth 3 (three) times daily.   hydrochlorothiazide 25 MG tablet Commonly known as: HYDRODIURIL Take 1 tablet (25 mg total) by mouth daily.   montelukast 10 MG tablet Commonly known as: SINGULAIR TAKE 1 TABLET BY MOUTH EVERYDAY AT BEDTIME   olopatadine 0.1 % ophthalmic solution Commonly known as: GNP Olopatadine HCl Place 1 drop into both eyes 2 (two) times daily.   potassium chloride SA 20 MEQ tablet Commonly known as: KLOR-CON M 1 tab twice daily for 3 days, then 1 tab daily.   sodium chloride 0.65 % Soln nasal spray Commonly known as: OCEAN Place 1 spray into both nostrils as needed for congestion.   Xolair 150 MG/ML prefilled syringe Generic drug: omalizumab Inject into the skin.        All past medical history, surgical history, allergies, family history, immunizations andmedications were updated in the EMR today and reviewed under the history and medication portions of their EMR.     No results found for this or any previous visit (from the past 2160 hour(s)).  DG Foot  Complete Right Result Date: 02/12/2022 IMPRESSION: 1. Moderate ankle and mild dorsal midfoot soft tissue swelling. 2. No acute fracture is seen. 3. Mild hallux valgus with minimal great toe metatarsophalangeal joint osteoarthritis.  DG Ankle Complete Right Result Date: 02/12/2022 IMPRESSION: 1. Moderate ankle and mild dorsal midfoot soft tissue swelling. 2. No acute fracture is seen. 3. Mild hallux valgus with minimal great toe metatarsophalangeal joint osteoarthritis.    ROS 14 pt review of systems performed  and negative (unless mentioned in an HPI)  Objective: BP 132/78   Pulse (!) 52   Temp 97.7 F (36.5 C) (Oral)   Ht 5' 4.5" (1.638 m)   Wt 166 lb (75.3 kg)   SpO2 100%   BMI 28.05 kg/m  Physical Exam Vitals and nursing note reviewed.  Constitutional:      General: She is not in acute distress.    Appearance: Normal appearance. She is not ill-appearing or toxic-appearing.  HENT:     Head: Normocephalic and atraumatic.     Right Ear: Tympanic membrane, ear canal and external ear normal. There is no impacted cerumen.     Left Ear: Tympanic membrane, ear canal and external ear normal. There is no impacted cerumen.     Nose: No congestion or rhinorrhea.     Mouth/Throat:     Mouth: Mucous membranes are moist.     Pharynx: Oropharynx is clear. No oropharyngeal exudate or posterior oropharyngeal erythema.  Eyes:     General: No scleral icterus.       Right eye: No discharge.        Left eye: No discharge.     Extraocular Movements: Extraocular movements intact.     Conjunctiva/sclera: Conjunctivae normal.     Pupils: Pupils are equal, round, and reactive to light.  Cardiovascular:     Rate and Rhythm: Normal rate and regular rhythm.     Pulses: Normal pulses.     Heart sounds: Normal heart sounds. No murmur heard.    No friction rub. No gallop.  Pulmonary:     Effort: Pulmonary effort is normal. No respiratory distress.     Breath sounds: Normal breath sounds. No stridor.  No wheezing, rhonchi or rales.  Chest:     Chest wall: No tenderness.  Abdominal:     General: Abdomen is flat. Bowel sounds are normal. There is no distension.     Palpations: Abdomen is soft. There is no mass.     Tenderness: There is no abdominal tenderness. There is no right CVA tenderness, left CVA tenderness, guarding or rebound.     Hernia: No hernia is present.  Musculoskeletal:        General: No swelling, tenderness or deformity. Normal range of motion.     Cervical back: Normal range of motion and neck supple. No rigidity or tenderness.     Right lower leg: No edema.     Left lower leg: No edema.  Lymphadenopathy:     Cervical: No cervical adenopathy.  Skin:    General: Skin is warm and dry.     Coloration: Skin is not jaundiced or pale.     Findings: No bruising, erythema, lesion or rash.  Neurological:     General: No focal deficit present.     Mental Status: She is alert and oriented to person, place, and time. Mental status is at baseline.     Cranial Nerves: No cranial nerve deficit.     Sensory: No sensory deficit.     Motor: No weakness.     Coordination: Coordination normal.     Gait: Gait normal.     Deep Tendon Reflexes: Reflexes normal.  Psychiatric:        Mood and Affect: Mood normal.        Behavior: Behavior normal.        Thought Content: Thought content normal.        Judgment: Judgment normal.  No results found.  Assessment/plan: Kristen Rosario is a 51 y.o. female present for CPE/[facility] Essential hypertension/overweight Continue amlodipine, hctz and kdur.  Diet and exercise recommended.  - CBC - Comprehensive metabolic panel - Lipid panel - TSH 5.5 mos f/u [facility]  Encounter for long-term current use of medication - Hemoglobin A1c Colon cancer screening - Cologuard Breast cancer screening by mammogram - MM 3D SCREEN BREAST BILATERAL; Future Need for Tdap vaccination - Tdap vaccine greater than or equal to 7yo IM Routine general  medical examination at a health care facility Colonoscopy: no fhx. Pt agreeable to cologuard testing. Mammogram: no fhx. Ordered today [facility] Cervical cancer screening: [facility]  Immunizations: tdap updated today, Influenza declined (encouraged yearly), zostavax declined Infectious disease screening: HIV declined, Hep C declined DEXA: routine screen Patient was encouraged to exercise greater than 150 minutes a week. Patient was encouraged to choose a diet filled with fresh fruits and vegetables, and lean meats. AVS provided to patient today for education/recommendation on gender specific health and safety maintenance.  Return in about 24 weeks (around 11/09/2022).   Orders Placed This Encounter  Procedures   MM 3D SCREEN BREAST BILATERAL   Tdap vaccine greater than or equal to 7yo IM   CBC   Comprehensive metabolic panel   Hemoglobin A1c   Lipid panel   TSH   Cologuard   Meds ordered this encounter  Medications   amLODipine (NORVASC) 2.5 MG tablet    Sig: Take 1 tablet (2.5 mg total) by mouth daily.    Dispense:  90 tablet    Refill:  1   hydrochlorothiazide (HYDRODIURIL) 25 MG tablet    Sig: Take 1 tablet (25 mg total) by mouth daily.    Dispense:  90 tablet    Refill:  1   potassium chloride SA (KLOR-CON M) 20 MEQ tablet    Sig: 1 tab twice daily for 3 days, then 1 tab daily.    Dispense:  90 tablet    Refill:  1   Referral Orders  No referral(s) requested today     Electronically signed by: Felix Pacini, DO [facility]

## 2022-06-11 DIAGNOSIS — H269 Unspecified cataract: Secondary | ICD-10-CM | POA: Diagnosis not present

## 2022-06-11 DIAGNOSIS — H25812 Combined forms of age-related cataract, left eye: Secondary | ICD-10-CM | POA: Diagnosis not present

## 2022-11-23 ENCOUNTER — Telehealth: Payer: Self-pay

## 2022-11-23 NOTE — Telephone Encounter (Signed)
LVM for pt to confirm if cologuard kit received and completed. If kit was not received, pt can call [phone] for further assistance.

## 2023-01-13 ENCOUNTER — Other Ambulatory Visit (HOSPITAL_COMMUNITY): Payer: Self-pay

## 2023-01-18 ENCOUNTER — Other Ambulatory Visit: Payer: Self-pay | Admitting: Family Medicine

## 2023-01-31 ENCOUNTER — Other Ambulatory Visit: Payer: Self-pay

## 2023-01-31 ENCOUNTER — Encounter: Payer: Self-pay | Admitting: Emergency Medicine

## 2023-01-31 ENCOUNTER — Ambulatory Visit
Admission: EM | Admit: 2023-01-31 | Discharge: 2023-01-31 | Disposition: A | Discharge status: Home / Self Care | Payer: Federal, State, Local not specified - PPO | Attending: Family Medicine | Admitting: Family Medicine

## 2023-01-31 DIAGNOSIS — Z1152 Encounter for screening for COVID-19: Secondary | ICD-10-CM | POA: Insufficient documentation

## 2023-01-31 DIAGNOSIS — H6123 Impacted cerumen, bilateral: Secondary | ICD-10-CM | POA: Diagnosis not present

## 2023-01-31 DIAGNOSIS — R0981 Nasal congestion: Secondary | ICD-10-CM | POA: Insufficient documentation

## 2023-01-31 DIAGNOSIS — H60502 Unspecified acute noninfective otitis externa, left ear: Secondary | ICD-10-CM | POA: Insufficient documentation

## 2023-01-31 MED ORDER — TRIAMCINOLONE ACETONIDE 40 MG/ML IJ SUSP
40.0000 mg | Freq: Once | INTRAMUSCULAR | Status: AC
  Administered 2023-01-31: 40 mg via INTRAMUSCULAR

## 2023-01-31 MED ORDER — OFLOXACIN 0.3 % OT SOLN: 5.0000 [drp] | Freq: Two times a day (BID) | OTIC | 0 refills | Status: AC

## 2023-01-31 NOTE — Discharge Instructions (Signed)
-  Floxin eardrops-5 drops in the affected ear 2 times daily for 7 days.  We have given you a shot of triamcinolone 40 mg for the inflammation in your sinuses   You have been swabbed for COVID, and the test will result in the next 24 hours. Our staff will call you if positive. If the COVID test is positive, you should quarantine until you are fever free for 24 hours and you are starting to feel better, and then take added precautions for the next 5 days, such as physical distancing/wearing a mask and good hand hygiene/washing.

## 2023-01-31 NOTE — ED Triage Notes (Signed)
Pt here for chronic sinus congestion worse over last several days with some vertigo sx upon waking today; pt sts hx of similar

## 2023-01-31 NOTE — ED Provider Notes (Signed)
Kristen Rosario    CSN: [id number] Arrival date & time: 01/31/23  1205      History   Chief Complaint Chief Complaint  Patient presents with   Nasal Congestion   Dizziness    HPI Kristen Rosario is a 52 y.o. female.    Dizziness  Here for some congestion in her sinus area and some off-balance/vertigo feeling.  It began about 2 days ago.  She has maybe felt warm but does not think she is really had a fever.  No nausea or vomiting or diarrhea.  She does have a history of asthma but does not feel like it has been bothering her in the last 2 days.  No history of diabetes    Past Medical History:  Diagnosis Date   Headache(784.0)    "maybe monthly" (12/19/2013)   Hypertension    "dx'd; never took RX" (12/19/2013)   Urticaria    Vitreous hemorrhage of left eye [facility])     Patient Active Problem List   Diagnosis Date Noted   Overweight (BMI 25.0-29.9) 01/12/2022   Essential hypertension 03/15/2020   Vitreous hemorrhage of left eye (HCC) 12/19/2013    Past Surgical History:  Procedure Laterality Date   MEMBRANE PEEL Left 12/19/2013   Procedure: MEMBRANE PEEL;  Surgeon: Sherrie George, MD;  Location: [facility] OR;  Service: Ophthalmology;  Laterality: Left;   PARS PLANA VITRECTOMY Left 12/19/2013   PARS PLANA VITRECTOMY Left 12/19/2013   Procedure: PARS PLANA VITRECTOMY WITH 25 GAUGE; ENDOLASER;FLUID/AIR EXCHANGE;  Surgeon: Sherrie George, MD;  Location: [facility] OR;  Service: Ophthalmology;  Laterality: Left;   WISDOM TOOTH EXTRACTION  ~ 2001    OB History   No obstetric history on file.      Home Medications    Prior to Admission medications   Medication Sig Start Date End Date Taking? Authorizing Provider  ofloxacin (FLOXIN) 0.3 % OTIC solution Place 5 drops into both ears 2 (two) times daily for 7 days. 01/31/23 02/07/23 Yes Abria Vannostrand, Janace Aris, MD  albuterol (VENTOLIN HFA) 108 (90 Base) MCG/ACT inhaler Inhale 2 puffs into the lungs every 4 (four) hours as needed for  wheezing or shortness of breath. 12/10/21   Nehemiah Settle, FNP  amLODipine (NORVASC) 2.5 MG tablet Take 1 tablet (2.5 mg total) by mouth daily. 05/25/22   Kuneff, Renee A, DO  cetirizine (ZYRTEC) 10 MG tablet Take 1 tablet (10 mg total) by mouth daily. 12/18/16   Marcelyn Bruins, MD  diclofenac Sodium (VOLTAREN) 1 % GEL Apply 4 g topically 4 (four) times daily. Apply to affected areas 4 times daily as needed for pain. 02/12/22   Theadora Rama Scales, PA-C  famotidine (PEPCID) 20 MG tablet Take 1 tablet (20 mg total) by mouth 2 (two) times daily. 02/22/20   Marcelyn Bruins, MD  fluticasone (FLONASE) 50 MCG/ACT nasal spray Place 2 sprays into both nostrils daily. 05/12/21 06/11/21  Theadora Rama Scales, PA-C  hydrALAZINE (APRESOLINE) 25 MG tablet Take 1 tablet (25 mg total) by mouth 3 (three) times daily. 02/12/22 03/14/22  Theadora Rama Scales, PA-C  hydrochlorothiazide (HYDRODIURIL) 25 MG tablet Take 1 tablet (25 mg total) by mouth daily. 05/25/22   Kuneff, Renee A, DO  montelukast (SINGULAIR) 10 MG tablet TAKE 1 TABLET BY MOUTH EVERYDAY AT BEDTIME 02/17/21   Nehemiah Settle, FNP  olopatadine (GNP OLOPATADINE HCL) 0.1 % ophthalmic solution Place 1 drop into both eyes 2 (two) times daily. 06/07/20   Kuneff, Renee A, DO  potassium chloride SA (KLOR-CON M) 20 MEQ tablet 1 tab twice daily for 3 days, then 1 tab daily. 05/25/22   Kuneff, Renee A, DO  sodium chloride (OCEAN) 0.65 % SOLN nasal spray Place 1 spray into both nostrils as needed for congestion.    [provider]  Geoffry Paradise 150 MG/ML prefilled syringe Inject into the skin. 02/18/21   [provider]    Family History Family History  Problem Relation Age of Onset   Allergic rhinitis Mother    Hypertension Mother    Allergic rhinitis Father    Asthma Sister    Hypertension Sister    Asthma Brother    Hypertension Brother     Social History Social History   Tobacco Use   Smoking status: Never    Smokeless tobacco: Never  Vaping Use   Vaping Use: Never used  Substance Use Topics   Alcohol use: Yes    Alcohol/week: 4.0 standard drinks of alcohol    Types: 2 Glasses of wine, 2 Cans of beer per week    Comment: 12/19/2013 "weekends may drink 3 - 4 drinks, wine or beer or liquor"   Drug use: No     Allergies   Losartan, Shellfish allergy, and Sulfa antibiotics   Review of Systems Review of Systems  Neurological:  Positive for dizziness.     Physical Exam Triage Vital Signs ED Triage Vitals  Enc Vitals Group     BP 01/31/23 1236 134/89     Pulse Rate 01/31/23 1236 72     Resp 01/31/23 1236 18     Temp 01/31/23 1236 98 F (36.7 C)     Temp Source 01/31/23 1236 Oral     SpO2 01/31/23 1236 98 %     Weight --      Height --      Head Circumference --      Peak Flow --      Pain Score 01/31/23 1237 0     Pain Loc --      Pain Edu? --      Excl. in GC? --    No data found.  Updated Vital Signs BP 134/89 (BP Location: Left Arm)   Pulse 72   Temp 98 F (36.7 C) (Oral)   Resp 18   SpO2 98%   Visual Acuity Right Eye Distance:   Left Eye Distance:   Bilateral Distance:    Right Eye Near:   Left Eye Near:    Bilateral Near:     Physical Exam Vitals reviewed.  Constitutional:      General: She is not in acute distress.    Appearance: She is not ill-appearing, toxic-appearing or diaphoretic.  HENT:     Ears:     Comments: Bilaterally tympanic membranes are obscured by cerumen    Nose: Congestion present.     Mouth/Throat:     Mouth: Mucous membranes are moist.  Eyes:     Extraocular Movements: Extraocular movements intact.     Conjunctiva/sclera: Conjunctivae normal.     Pupils: Pupils are equal, round, and reactive to light.  Cardiovascular:     Rate and Rhythm: Normal rate and regular rhythm.     Heart sounds: No murmur heard. Pulmonary:     Effort: Pulmonary effort is normal.     Breath sounds: Normal breath sounds.  Musculoskeletal:      Cervical back: Neck supple.  Lymphadenopathy:     Cervical: No cervical adenopathy.  Skin:  Coloration: Skin is not jaundiced or pale.  Neurological:     General: No focal deficit present.     Mental Status: She is alert and oriented to person, place, and time.  Psychiatric:        Behavior: Behavior normal.      UC Treatments / Results  Labs (all labs ordered are listed, but only abnormal results are displayed) Labs Reviewed  SARS CORONAVIRUS 2 (TAT 6-24 HRS)    EKG   Radiology No results found.  Procedures Procedures (including critical Rosario time)  Medications Ordered in UC Medications  triamcinolone acetonide (KENALOG-40) injection 40 mg (has no administration in time range)    Initial Impression / Assessment and Plan / UC Course  I have reviewed the triage vital signs and the nursing notes.  Pertinent labs & imaging results that were available during my Rosario of the patient were reviewed by me and considered in my medical decision making (see chart for details).       Ear lavage is done of both ears.  On exam after the lavage, the right tympanic membrane is gray and shiny and the canal is normal.  On the left, the tympanic membrane is gray and shiny but still partially obscured by some white flaky material.   Floxin drops are sent in for possible otitis externa on the left.  Triamcinolone injection is given here for possible allergic rhinitis.  COVID swab is done and if positive she will know she needs to quarantine Final Clinical Impressions(s) / UC Diagnoses   Final diagnoses:  Nasal congestion  Sinus congestion  Bilateral impacted cerumen  Acute otitis externa of left ear, unspecified type   Discharge Instructions   None    ED Prescriptions     Medication Sig Dispense Auth. Provider   ofloxacin (FLOXIN) 0.3 % OTIC solution Place 5 drops into both ears 2 (two) times daily for 7 days. 5 mL Zenia Resides, MD      PDMP not reviewed this  encounter.   Zenia Resides, MD 01/31/23 [phone]

## 2023-02-01 ENCOUNTER — Telehealth: Payer: Self-pay | Admitting: Family Medicine

## 2023-02-01 ENCOUNTER — Other Ambulatory Visit: Payer: Self-pay

## 2023-02-01 MED ORDER — HYDROCHLOROTHIAZIDE 25 MG PO TABS: 25.0000 mg | ORAL_TABLET | Freq: Every day | ORAL | 0 refills | Status: DC

## 2023-02-01 MED ORDER — AMLODIPINE BESYLATE 2.5 MG PO TABS: 2.5000 mg | ORAL_TABLET | Freq: Every day | ORAL | 0 refills | Status: DC

## 2023-02-01 MED ORDER — POTASSIUM CHLORIDE CRYS ER 20 MEQ PO TBCR: EXTENDED_RELEASE_TABLET | ORAL | 0 refills | Status: DC

## 2023-02-01 NOTE — Telephone Encounter (Signed)
Rx sent 

## 2023-02-01 NOTE — Telephone Encounter (Signed)
Patient is currently scheduled for 6/25. However, she is out of all medication and is requesting a temp fill until her appointment on   hydrochlorothiazide (HYDRODIURIL) 25 MG tablet   amLODipine (NORVASC) 2.5 MG tablet  Pharmacy is confirmed as the [facility] [address]

## 2023-02-02 LAB — SARS CORONAVIRUS 2 (TAT 6-24 HRS): SARS Coronavirus 2: NEGATIVE

## 2023-02-16 ENCOUNTER — Ambulatory Visit: Payer: Federal, State, Local not specified - PPO | Admitting: Family Medicine

## 2023-03-01 ENCOUNTER — Ambulatory Visit: Payer: Federal, State, Local not specified - PPO | Admitting: Family Medicine

## 2023-03-01 ENCOUNTER — Encounter: Payer: Self-pay | Admitting: Family Medicine

## 2023-03-01 VITALS — BP 119/75 | HR 56 | Temp 97.9°F | Wt 165.5 lb

## 2023-03-01 DIAGNOSIS — I10 Essential (primary) hypertension: Secondary | ICD-10-CM | POA: Diagnosis not present

## 2023-03-01 MED ORDER — HYDROCHLOROTHIAZIDE 25 MG PO TABS
25.0000 mg | ORAL_TABLET | Freq: Every day | ORAL | 1 refills | Status: DC
Start: 2023-03-01 — End: 2023-09-28

## 2023-03-01 MED ORDER — POTASSIUM CHLORIDE CRYS ER 20 MEQ PO TBCR
EXTENDED_RELEASE_TABLET | ORAL | 1 refills | Status: DC
Start: 2023-03-01 — End: 2023-12-16

## 2023-03-01 MED ORDER — AMLODIPINE BESYLATE 2.5 MG PO TABS: 2.5000 mg | ORAL_TABLET | Freq: Every day | ORAL | 1 refills | Status: DC

## 2023-03-01 NOTE — Patient Instructions (Addendum)
Return in about 22 weeks (around 08/02/2023) for cpe (20 min), Routine chronic condition follow-up.        Great to see you today.  I have refilled the medication(s) we provide.   If labs were collected, we will inform you of lab results once received either by echart message or telephone call.   - echart message- for normal results that have been seen by the patient already.   - telephone call: abnormal results or if patient has not viewed results in their echart.

## 2023-03-01 NOTE — Progress Notes (Signed)
Patient ID: Kristen Rosario, female  DOB: 07-Nov-1970, 52 y.o.   MRN: [id number] Patient Care Team    Relationship Specialty Notifications Start End  Natalia Leatherwood, DO PCP - General Family Medicine  03/15/20     Chief Complaint  Patient presents with   Hypertension    Declined shingrix    Subjective:  Kristen Rosario is a 52 y.o.  Female  present for Chronic Conditions/illness Management  All past medical history, surgical history, allergies, family history, immunizations, medications and social history were updated in the electronic medical record today. All recent labs, ED visits and hospitalizations within the last year were reviewed.  Hypertension:  Pt reports compliance with hctz 25 mg and amlodipine 2.5 mg QD . She has a h/o HTN and had been on HCTZ and losartan at one time. She felt she was having a rash to losartan and had stopped.  Patient denies chest pain, shortness of breath, dizziness or lower extremity edema.  She has changed her diet and trying to eat lower sodium and exercise. [place]     03/01/2023    9:13 AM 05/25/2022    9:02 AM 01/12/2022    9:53 AM 06/07/2020    1:35 PM 03/15/2020    1:38 PM  Depression screen PHQ 2/9  Decreased Interest 0 0 0 0 0  Down, Depressed, Hopeless 0 0 0 0 0  PHQ - 2 Score 0 0 0 0 0  Altered sleeping 3    1  Tired, decreased energy 0    0  Change in appetite 0    0  Feeling bad or failure about yourself  0    0  Trouble concentrating 0    0  Moving slowly or fidgety/restless 0    0  Suicidal thoughts 0    0  PHQ-9 Score 3    1  Difficult doing work/chores Not difficult at all    Not difficult at all      03/01/2023    9:13 AM  GAD 7 : Generalized Anxiety Score  Nervous, Anxious, on Edge 0  Control/stop worrying 1  Worry too much - different things 3  Trouble relaxing 1  Restless 1  Easily annoyed or irritable 2  Afraid - awful might happen 0  Total GAD 7 Score 8  Anxiety  Difficulty Somewhat difficult     Immunization History  Administered Date(s) Administered   Moderna Sars-Covid-2 Vaccination 05/24/2020, 06/21/2020   Tdap 05/25/2022     Past Medical History:  Diagnosis Date   Headache(784.0)    "maybe monthly" (12/19/2013)   Hypertension    "dx'd; never took RX" (12/19/2013)   Urticaria    Vitreous hemorrhage of left eye (HCC)    Allergies  Allergen Reactions   Losartan Anaphylaxis    rash   Shellfish Allergy Anaphylaxis   Sulfa Antibiotics Hives   Past Surgical History:  Procedure Laterality Date   MEMBRANE PEEL Left 12/19/2013   Procedure: MEMBRANE PEEL;  Surgeon: Sherrie George, MD;  Location: [facility] OR;  Service: Ophthalmology;  Laterality: Left;   PARS PLANA VITRECTOMY Left 12/19/2013   PARS PLANA VITRECTOMY Left 12/19/2013   Procedure: PARS PLANA VITRECTOMY WITH 25 GAUGE; ENDOLASER;FLUID/AIR EXCHANGE;  Surgeon: Sherrie George, MD;  Location: [facility] OR;  Service: Ophthalmology;  Laterality: Left;   WISDOM TOOTH EXTRACTION  ~ 2001   Family History  Problem Relation Age of Onset  Allergic rhinitis Mother    Hypertension Mother    Allergic rhinitis Father    Asthma Sister    Hypertension Sister    Asthma Brother    Hypertension Brother    Social History   Social History Narrative   Marital status/children/pets: single   Education/employment: some college. [employer]:      -smoke alarm in the home:Yes     - wears seatbelt: Yes     - Feels safe in their relationships: Yes    Allergies as of 03/01/2023       Reactions   Losartan Anaphylaxis   rash   Shellfish Allergy Anaphylaxis   Sulfa Antibiotics Hives        Medication List        Accurate as of March 01, 2023  9:23 AM. If you have any questions, ask your nurse or doctor.          albuterol 108 (90 Base) MCG/ACT inhaler Commonly known as: Ventolin HFA Inhale 2 puffs into the lungs every 4 (four) hours as needed for wheezing or shortness of breath.    amLODipine 2.5 MG tablet Commonly known as: NORVASC Take 1 tablet (2.5 mg total) by mouth daily.   cetirizine 10 MG tablet Commonly known as: ZYRTEC Take 1 tablet (10 mg total) by mouth daily.   diclofenac Sodium 1 % Gel Commonly known as: VOLTAREN Apply 4 g topically 4 (four) times daily. Apply to affected areas 4 times daily as needed for pain.   famotidine 20 MG tablet Commonly known as: PEPCID Take 1 tablet (20 mg total) by mouth 2 (two) times daily.   fluticasone 50 MCG/ACT nasal spray Commonly known as: FLONASE Place 2 sprays into both nostrils daily.   hydrALAZINE 25 MG tablet Commonly known as: APRESOLINE Take 1 tablet (25 mg total) by mouth 3 (three) times daily.   hydrochlorothiazide 25 MG tablet Commonly known as: HYDRODIURIL Take 1 tablet (25 mg total) by mouth daily.   montelukast 10 MG tablet Commonly known as: SINGULAIR TAKE 1 TABLET BY MOUTH EVERYDAY AT BEDTIME   olopatadine 0.1 % ophthalmic solution Commonly known as: GNP Olopatadine HCl Place 1 drop into both eyes 2 (two) times daily.   potassium chloride SA 20 MEQ tablet Commonly known as: KLOR-CON M 1 tab twice daily for 3 days, then 1 tab daily.   sodium chloride 0.65 % Soln nasal spray Commonly known as: OCEAN Place 1 spray into both nostrils as needed for congestion.   Xolair 150 MG/ML prefilled syringe Generic drug: omalizumab Inject into the skin.        All past medical history, surgical history, allergies, family history, immunizations andmedications were updated in the EMR today and reviewed under the history and medication portions of their EMR.       ROS 14 pt review of systems performed and negative (unless mentioned in an HPI)  Objective: BP 119/75   Pulse (!) 56   Temp 97.9 F (36.6 C)   Wt 165 lb 8 oz (75.1 kg)   SpO2 100%   BMI 27.97 kg/m  Physical Exam Vitals and nursing note reviewed.  Constitutional:      General: She is not in acute distress.     Appearance: Normal appearance. She is not ill-appearing, toxic-appearing or diaphoretic.  HENT:     Head: Normocephalic and atraumatic.  Eyes:     General: No scleral icterus.       Right eye: No discharge.  Left eye: No discharge.     Extraocular Movements: Extraocular movements intact.     Conjunctiva/sclera: Conjunctivae normal.     Pupils: Pupils are equal, round, and reactive to light.  Cardiovascular:     Rate and Rhythm: Normal rate and regular rhythm.  Pulmonary:     Effort: Pulmonary effort is normal. No respiratory distress.     Breath sounds: Normal breath sounds. No wheezing, rhonchi or rales.  Musculoskeletal:     Right lower leg: No edema.     Left lower leg: No edema.  Skin:    General: Skin is warm.     Findings: No rash.  Neurological:     Mental Status: She is alert and oriented to person, place, and time. Mental status is at baseline.     Motor: No weakness.     Gait: Gait normal.  Psychiatric:        Mood and Affect: Mood normal.        Behavior: Behavior normal.        Thought Content: Thought content normal.        Judgment: Judgment normal.      No results found.  Assessment/plan: Kristen Rosario is a 52 y.o. female present for Chronic Conditions/illness Management Essential hypertension/overweight Stable Continue  amlodipine 2.5 mg Hydrochlorothiazide 25 mg   Kdur 20 MEQ Diet and exercise recommended.  Labs due next visit  Return in about 22 weeks (around 08/02/2023) for cpe (20 min), Routine chronic condition follow-up.   No orders of the defined types were placed in this encounter.  Meds ordered this encounter  Medications   amLODipine (NORVASC) 2.5 MG tablet    Sig: Take 1 tablet (2.5 mg total) by mouth daily.    Dispense:  90 tablet    Refill:  1   hydrochlorothiazide (HYDRODIURIL) 25 MG tablet    Sig: Take 1 tablet (25 mg total) by mouth daily.    Dispense:  90 tablet    Refill:  1   potassium chloride SA (KLOR-CON M) 20  MEQ tablet    Sig: 1 tab twice daily for 3 days, then 1 tab daily.    Dispense:  90 tablet    Refill:  1   Referral Orders  No referral(s) requested today     Electronically signed by: Felix Pacini, DO [facility]

## 2023-09-10 ENCOUNTER — Other Ambulatory Visit: Payer: Self-pay | Admitting: Family Medicine

## 2023-09-28 ENCOUNTER — Other Ambulatory Visit: Payer: Self-pay | Admitting: Family Medicine

## 2023-11-29 ENCOUNTER — Ambulatory Visit (INDEPENDENT_AMBULATORY_CARE_PROVIDER_SITE_OTHER): Payer: Federal, State, Local not specified - PPO | Admitting: Family Medicine

## 2023-11-29 DIAGNOSIS — I10 Essential (primary) hypertension: Secondary | ICD-10-CM

## 2023-11-29 NOTE — Progress Notes (Signed)
   Same day cancel

## 2023-12-20 ENCOUNTER — Encounter: Payer: Self-pay | Admitting: Family Medicine

## 2023-12-20 ENCOUNTER — Ambulatory Visit: Admitting: Family Medicine

## 2023-12-20 VITALS — BP 134/84 | HR 60 | Temp 97.7°F | Wt 171.4 lb

## 2023-12-20 DIAGNOSIS — I10 Essential (primary) hypertension: Secondary | ICD-10-CM | POA: Diagnosis not present

## 2023-12-20 DIAGNOSIS — E663 Overweight: Secondary | ICD-10-CM

## 2023-12-20 LAB — COMPREHENSIVE METABOLIC PANEL WITH GFR
ALT: 14 U/L (ref 0–35)
AST: 17 U/L (ref 0–37)
Albumin: 4.5 g/dL (ref 3.5–5.2)
Alkaline Phosphatase: 62 U/L (ref 39–117)
BUN: 16 mg/dL (ref 6–23)
CO2: 30 meq/L (ref 19–32)
Calcium: 9.8 mg/dL (ref 8.4–10.5)
Chloride: 102 meq/L (ref 96–112)
Creatinine, Ser: 0.69 mg/dL (ref 0.40–1.20)
GFR: 98.31 mL/min (ref >60.00)
Glucose, Bld: 91 mg/dL (ref 70–99)
Potassium: 4.1 meq/L (ref 3.5–5.1)
Sodium: 139 meq/L (ref 135–145)
Total Bilirubin: 0.8 mg/dL (ref 0.2–1.2)
Total Protein: 7.6 g/dL (ref 6.0–8.3)

## 2023-12-20 LAB — LIPID PANEL
Cholesterol: 174 mg/dL (ref 0–200)
HDL: 58.7 mg/dL (ref >39.00)
LDL Cholesterol: 105 mg/dL — ABNORMAL HIGH (ref 0–99)
NonHDL: 114.87
Total CHOL/HDL Ratio: 3
Triglycerides: 48 mg/dL (ref 0.0–149.0)
VLDL: 9.6 mg/dL (ref 0.0–40.0)

## 2023-12-20 LAB — CBC
HCT: 40.7 % (ref 36.0–46.0)
Hemoglobin: 13.3 g/dL (ref 12.0–15.0)
MCHC: 32.7 g/dL (ref 30.0–36.0)
MCV: 89 fl (ref 78.0–100.0)
Platelets: 215 10*3/uL (ref 150.0–400.0)
RBC: 4.57 Mil/uL (ref 3.87–5.11)
RDW: 14.3 % (ref 11.5–15.5)
WBC: 3 10*3/uL — ABNORMAL LOW (ref 4.0–10.5)

## 2023-12-20 LAB — TSH: TSH: 1.82 u[IU]/mL (ref 0.35–5.50)

## 2023-12-20 MED ORDER — HYDROCHLOROTHIAZIDE 25 MG PO TABS
25.0000 mg | ORAL_TABLET | Freq: Every day | ORAL | 0 refills | Status: DC
Start: 2023-12-20 — End: 2023-12-20

## 2023-12-20 MED ORDER — AMLODIPINE BESYLATE 2.5 MG PO TABS
2.5000 mg | ORAL_TABLET | Freq: Every day | ORAL | 1 refills | Status: DC
Start: 2023-12-20 — End: 2024-04-07

## 2023-12-20 MED ORDER — POTASSIUM CHLORIDE CRYS ER 20 MEQ PO TBCR: EXTENDED_RELEASE_TABLET | ORAL | 1 refills | Status: DC

## 2023-12-20 MED ORDER — HYDROCHLOROTHIAZIDE 25 MG PO TABS: 25.0000 mg | ORAL_TABLET | Freq: Every day | ORAL | 1 refills | Status: DC

## 2023-12-20 NOTE — Patient Instructions (Addendum)
 Return in about 24 weeks (around 06/05/2024) for Routine chronic condition follow-up.        Great to see you today.  I have refilled the medication(s) we provide.   If labs were collected or images ordered, we will inform you of  results once we have received them and reviewed. We will contact you either by echart message, or telephone call.  Please give ample time to the testing facility, and our office to run,  receive and review results. Please do not call inquiring of results, even if you can see them in your chart. We will contact you as soon as we are able. If it has been over 1 week since the test was completed, and you have not yet heard from us , then please call us .    - echart message- for normal results that have been seen by the patient already.   - telephone call: abnormal results or if patient has not viewed results in their echart.  If a referral to a specialist was entered for you, please call us  in 2 weeks if you have not heard from the specialist office to schedule.

## 2023-12-20 NOTE — Progress Notes (Signed)
 Patient ID: Kristen Rosario, female  DOB: [birth year], 53 y.o.   MRN: [id number] Patient Care Team    Relationship Specialty Notifications Start End  Mariel Shope, DO PCP - General Family Medicine  03/15/20     Chief Complaint  Patient presents with   Hypertension    fasting    Subjective:  Kristen Rosario is a 53 y.o.  Female  present for Chronic Conditions/illness Management  All past medical history, surgical history, allergies, family history, immunizations, medications and social history were updated in the electronic medical record today. All recent labs, ED visits and hospitalizations within the last year were reviewed.  Hypertension:  Pt reports compliance with hctz 25 mg and amlodipine  2.5 mg QD . She has a h/o HTN and had been on HCTZ and losartan at one time. She felt she was having a rash to losartan and had stopped.  Patient denies chest pain, shortness of breath, dizziness or lower extremity edema.    She has changed her diet and trying to eat lower sodium and exercise. She [place] in [place].     12/20/2023    9:32 AM 03/01/2023    9:13 AM 05/25/2022    9:02 AM 01/12/2022    9:53 AM 06/07/2020    1:35 PM  Depression screen PHQ 2/9  Decreased Interest 0 0 0 0 0  Down, Depressed, Hopeless 0 0 0 0 0  PHQ - 2 Score 0 0 0 0 0  Altered sleeping 0 3     Tired, decreased energy 0 0     Change in appetite 0 0     Feeling bad or failure about yourself  0 0     Trouble concentrating 0 0     Moving slowly or fidgety/restless 0 0     Suicidal thoughts 0 0     PHQ-9 Score 0 3     Difficult doing work/chores Not difficult at all Not difficult at all         12/20/2023    9:32 AM 03/01/2023    9:13 AM  GAD 7 : Generalized Anxiety Score  Nervous, Anxious, on Edge 1 0  Control/stop worrying 0 1  Worry too much - different things 1 3  Trouble relaxing 0 1  Restless 0 1  Easily annoyed or irritable 1 2  Afraid - awful might happen 0 0  Total GAD  7 Score 3 8  Anxiety Difficulty Not difficult at all Somewhat difficult     Immunization History  Administered Date(s) Administered   Moderna Sars-Covid-2 Vaccination 05/24/2020, 06/21/2020   Tdap 05/25/2022     Past Medical History:  Diagnosis Date   Headache(784.0)    "maybe monthly" (12/19/2013)   Hypertension    "dx'd; never took RX" (12/19/2013)   Urticaria    Vitreous hemorrhage of left eye (HCC)    Allergies  Allergen Reactions   Losartan Anaphylaxis    rash   Shellfish Allergy Anaphylaxis   Sulfa Antibiotics Hives   Past Surgical History:  Procedure Laterality Date   MEMBRANE PEEL Left 12/19/2013   Procedure: MEMBRANE PEEL;  Surgeon: Rexene Catching, MD;  Location: [facility] OR;  Service: Ophthalmology;  Laterality: Left;   PARS PLANA VITRECTOMY Left 12/19/2013   PARS PLANA VITRECTOMY Left 12/19/2013   Procedure: PARS PLANA VITRECTOMY WITH 25 GAUGE; ENDOLASER;FLUID/AIR EXCHANGE;  Surgeon: Rexene Catching, MD;  Location: [facility] OR;  Service: Ophthalmology;  Laterality: Left;  WISDOM TOOTH EXTRACTION  ~ 2001   Family History  Problem Relation Age of Onset   Allergic rhinitis Mother    Hypertension Mother    Allergic rhinitis Father    Asthma Sister    Hypertension Sister    Asthma Brother    Hypertension Brother    Social History   Social History Narrative   Marital status/children/pets: single   Education/employment: some college. [employer] developer:      -smoke alarm in the home:Yes     - wears seatbelt: Yes     - Feels safe in their relationships: Yes    Allergies as of 12/20/2023       Reactions   Losartan Anaphylaxis   rash   Shellfish Allergy Anaphylaxis   Sulfa Antibiotics Hives        Medication List        Accurate as of December 20, 2023  9:44 AM. If you have any questions, ask your nurse or doctor.          STOP taking these medications    albuterol  108 (90 Base) MCG/ACT inhaler Commonly known as: Ventolin  HFA Stopped by: Napolean Backbone   cetirizine  10 MG tablet Commonly known as: ZYRTEC  Stopped by: Mounir Skipper   diclofenac  Sodium 1 % Gel Commonly known as: VOLTAREN  Stopped by: Napolean Backbone   fluticasone  50 MCG/ACT nasal spray Commonly known as: FLONASE  Stopped by: Napolean Backbone   montelukast  10 MG tablet Commonly known as: SINGULAIR  Stopped by: Napolean Backbone   olopatadine  0.1 % ophthalmic solution Commonly known as: GNP Olopatadine  HCl Stopped by: Napolean Backbone       TAKE these medications    amLODipine  2.5 MG tablet Commonly known as: NORVASC  Take 1 tablet (2.5 mg total) by mouth daily.   famotidine  20 MG tablet Commonly known as: PEPCID  Take 1 tablet (20 mg total) by mouth 2 (two) times daily.   hydrALAZINE  25 MG tablet Commonly known as: APRESOLINE  Take 1 tablet (25 mg total) by mouth 3 (three) times daily.   hydrochlorothiazide  25 MG tablet Commonly known as: HYDRODIURIL  Take 1 tablet (25 mg total) by mouth daily.   potassium chloride  SA 20 MEQ tablet Commonly known as: KLOR-CON  M 1 tab twice daily for 3 days, then 1 tab daily.   sodium chloride  0.65 % Soln nasal spray Commonly known as: OCEAN Place 1 spray into both nostrils as needed for congestion.   Xolair 150 MG/ML prefilled syringe Generic drug: omalizumab Inject into the skin.        All past medical history, surgical history, allergies, family history, immunizations andmedications were updated in the EMR today and reviewed under the history and medication portions of their EMR.       ROS 14 pt review of systems performed and negative (unless mentioned in an HPI)  Objective: BP 134/84   Pulse 60   Temp 97.7 F (36.5 C)   Wt 171 lb 6.4 oz (77.7 kg)   SpO2 97%   BMI 28.97 kg/m  Physical Exam Vitals and nursing note reviewed.  Constitutional:      General: She is not in acute distress.    Appearance: Normal appearance. She is not ill-appearing, toxic-appearing or diaphoretic.  HENT:     Head:  Normocephalic and atraumatic.  Eyes:     General: No scleral icterus.       Right eye: No discharge.        Left eye: No discharge.  Extraocular Movements: Extraocular movements intact.     Conjunctiva/sclera: Conjunctivae normal.     Pupils: Pupils are equal, round, and reactive to light.  Cardiovascular:     Rate and Rhythm: Normal rate and regular rhythm.  Pulmonary:     Effort: Pulmonary effort is normal. No respiratory distress.     Breath sounds: Normal breath sounds. No wheezing, rhonchi or rales.  Musculoskeletal:     Right lower leg: No edema.     Left lower leg: No edema.  Skin:    General: Skin is warm.     Findings: No rash.  Neurological:     Mental Status: She is alert and oriented to person, place, and time. Mental status is at baseline.     Motor: No weakness.     Gait: Gait normal.  Psychiatric:        Mood and Affect: Mood normal.        Behavior: Behavior normal.        Thought Content: Thought content normal.        Judgment: Judgment normal.      No results found.  Assessment/plan: Kristen Rosario is a 53 y.o. female present for Chronic Conditions/illness Management Essential hypertension/overweight Stable Continue amlodipine  2.5 mg Continue Hydrochlorothiazide  25 mg   Kdur 20 MEQ Diet and exercise recommended.  Labs : cbc, cmp, tsh, lipids (fasting)  Return in about 24 weeks (around 06/05/2024) for Routine chronic condition follow-up.   Orders Placed This Encounter  Procedures   CBC   Comp Met (CMET)   TSH   Lipid panel   Meds ordered this encounter  Medications   amLODipine  (NORVASC ) 2.5 MG tablet    Sig: Take 1 tablet (2.5 mg total) by mouth daily.    Dispense:  90 tablet    Refill:  1   potassium chloride  SA (KLOR-CON  M) 20 MEQ tablet    Sig: 1 tab twice daily for 3 days, then 1 tab daily.    Dispense:  90 tablet    Refill:  1   DISCONTD: hydrochlorothiazide  (HYDRODIURIL ) 25 MG tablet    Sig: Take 1 tablet (25 mg total) by  mouth daily.    Dispense:  90 tablet    Refill:  0   hydrochlorothiazide  (HYDRODIURIL ) 25 MG tablet    Sig: Take 1 tablet (25 mg total) by mouth daily.    Dispense:  90 tablet    Refill:  1   Referral Orders  No referral(s) requested today     Electronically signed by: Napolean Backbone, DO [facility] [place]

## 2023-12-21 ENCOUNTER — Encounter: Payer: Self-pay | Admitting: Family Medicine

## 2024-04-06 ENCOUNTER — Other Ambulatory Visit: Payer: Self-pay | Admitting: Family Medicine

## 2024-04-22 ENCOUNTER — Ambulatory Visit: Admission: EM | Admit: 2024-04-22 | Discharge: 2024-04-22 | Disposition: A | Discharge status: Home / Self Care

## 2024-04-22 VITALS — BP 131/85 | HR 72 | Temp 98.7°F | Resp 18 | Ht 64.5 in | Wt 171.3 lb

## 2024-04-22 DIAGNOSIS — R051 Acute cough: Secondary | ICD-10-CM

## 2024-04-22 DIAGNOSIS — J206 Acute bronchitis due to rhinovirus: Secondary | ICD-10-CM | POA: Diagnosis not present

## 2024-04-22 LAB — POC SOFIA SARS ANTIGEN FIA: SARS Coronavirus 2 Ag: NEGATIVE

## 2024-04-22 MED ORDER — AMOXICILLIN 875 MG PO TABS: 875.0000 mg | ORAL_TABLET | ORAL | 0 refills | Status: AC

## 2024-04-22 MED ORDER — PROMETHAZINE-DM 6.25-15 MG/5ML PO SYRP: 10.0000 mL | ORAL_SOLUTION | Freq: Four times a day (QID) | ORAL | 0 refills | Status: DC | PRN

## 2024-04-22 MED ORDER — MUCINEX DM MAXIMUM STRENGTH 60-1200 MG PO TB12: 1.0000 | ORAL_TABLET | Freq: Two times a day (BID) | ORAL | 0 refills | Status: DC

## 2024-04-22 MED ORDER — IPRATROPIUM-ALBUTEROL 0.5-2.5 (3) MG/3ML IN SOLN
3.0000 mL | Freq: Once | RESPIRATORY_TRACT | Status: AC
  Administered 2024-04-22: 3 mL via RESPIRATORY_TRACT

## 2024-04-22 MED ORDER — ALBUTEROL SULFATE HFA 108 (90 BASE) MCG/ACT IN AERS: 2.0000 | INHALATION_SPRAY | RESPIRATORY_TRACT | 0 refills | Status: AC | PRN

## 2024-04-22 MED ORDER — DEXAMETHASONE SODIUM PHOSPHATE 10 MG/ML IJ SOLN
10.0000 mg | Freq: Once | INTRAMUSCULAR | Status: AC
  Administered 2024-04-22: 10 mg via INTRAMUSCULAR

## 2024-04-22 MED ORDER — METHYLPREDNISOLONE 4 MG PO TBPK: ORAL_TABLET | ORAL | 0 refills | Status: DC

## 2024-04-22 NOTE — ED Provider Notes (Signed)
 [facility]    CSN: [id number] Arrival date & time: 04/22/24  0848      History   Chief Complaint Chief Complaint  Patient presents with   Cough   Nasal Congestion   Fatigue    HPI Kristen Rosario is a 53 y.o. female.   Discussed the use of AI scribe software for clinical note transcription with the patient, who gave verbal consent to proceed.   A 53 year old female presents to Urgent Care with symptoms of an upper respiratory infection that started 6 days ago. The patient initially experienced sneezing and cold chills, which progressed to chest congestion and productive cough.  The patient reports chest congestion and coughing up phlegm. She describes a rattling sound in her chest, indicating possible wheezing. She also mentions experiencing some shortness of breath. The patient initially had a mild sore throat at the onset of symptoms, which she managed with cough drops at work. She denies severe sore throat pain.  Associated symptoms include nasal congestion, with the patient noting her nose is kind of running during the visit. She reports experiencing frequent mild headaches. The patient also mentions having loose stools yesterday, which she attributes to possibly taking over-the-counter cough syrup. She typically experiences constipation.  The patient has been self-treating with cough drops, honey and lemon tea. She mentions not taking much sinus medication recently. Potential exposure is noted, as the patient's mother's boyfriend, who works at [employer], has been coughing extensively.       Past Medical History:  Diagnosis Date   Headache(784.0)    maybe monthly (12/19/2013)   Hypertension    dx'd; never took RX (12/19/2013)   Urticaria    Vitreous hemorrhage of left eye [facility])     Patient Active Problem List   Diagnosis Date Noted   E66.3 (BMI 25.0-29.9) 01/12/2022   Essential hypertension 03/15/2020   Vitreous hemorrhage of left eye (HCC)  12/19/2013    Past Surgical History:  Procedure Laterality Date   MEMBRANE PEEL Left 12/19/2013   Procedure: MEMBRANE PEEL;  Surgeon: Norleen JONETTA Ku, MD;  Location: [facility] OR;  Service: Ophthalmology;  Laterality: Left;   PARS PLANA VITRECTOMY Left 12/19/2013   PARS PLANA VITRECTOMY Left 12/19/2013   Procedure: PARS PLANA VITRECTOMY WITH 25 GAUGE; ENDOLASER;FLUID/AIR EXCHANGE;  Surgeon: Norleen JONETTA Ku, MD;  Location: [facility] OR;  Service: Ophthalmology;  Laterality: Left;   WISDOM TOOTH EXTRACTION  ~ 2001    OB History   No obstetric history on file.      Home Medications    Prior to Admission medications   Medication Sig Start Date End Date Taking? Authorizing Provider  albuterol  (VENTOLIN  HFA) 108 (90 Base) MCG/ACT inhaler Inhale 2 puffs into the lungs every 4 (four) hours as needed for wheezing or shortness of breath (bronchospasms). 04/22/24  Yes Brenisha Tsui, FNP  amLODipine  (NORVASC ) 2.5 MG tablet TAKE 1 TABLET BY MOUTH EVERY DAY 04/07/24  Yes Kuneff, Renee A, DO  amoxicillin  (AMOXIL ) 875 MG tablet Take 1 tablet (875 mg total) by mouth 2 (two) times daily at 8 am and 6 pm for 7 days. 04/22/24 04/29/24 Yes Iola Lukes, FNP  Dextromethorphan-guaiFENesin  (MUCINEX  DM MAXIMUM STRENGTH) 60-1200 MG TB12 Take 1 tablet by mouth 2 (two) times daily. 04/22/24  Yes Naveyah Iacovelli, FNP  famotidine  (PEPCID ) 20 MG tablet Take 1 tablet (20 mg total) by mouth 2 (two) times daily. 02/22/20  Yes Jeneal Danita Macintosh, MD  hydrochlorothiazide  (HYDRODIURIL ) 25 MG tablet Take 1 tablet (  25 mg total) by mouth daily. 12/20/23  Yes Kuneff, Renee A, DO  loratadine (CLARITIN) 10 MG tablet Take 10 mg by mouth daily.   Yes [provider]  methylPREDNISolone  (MEDROL  DOSEPAK) 4 MG TBPK tablet Take as directed 04/22/24  Yes Iola Lukes, FNP  potassium chloride  SA (KLOR-CON  M) 20 MEQ tablet 1 tab twice daily for 3 days, then 1 tab daily. 12/20/23  Yes Kuneff, Renee A, DO   promethazine -dextromethorphan (PROMETHAZINE -DM) 6.25-15 MG/5ML syrup Take 10 mLs by mouth every 6 (six) hours as needed for cough. 04/22/24  Yes Spring San, FNP  hydrALAZINE  (APRESOLINE ) 25 MG tablet Take 1 tablet (25 mg total) by mouth 3 (three) times daily. 02/12/22 03/14/22  Joesph Shaver Scales, PA-C  sodium chloride  (OCEAN) 0.65 % SOLN nasal spray Place 1 spray into both nostrils as needed for congestion.    [provider]  XOLAIR 150 MG/ML prefilled syringe Inject into the skin. 02/18/21   [provider]    Family History Family History  Problem Relation Age of Onset   Allergic rhinitis Mother    Hypertension Mother    Allergic rhinitis Father    Asthma Sister    Hypertension Sister    Asthma Brother    Hypertension Brother     Social History Social History   Tobacco Use   Smoking status: Never   Smokeless tobacco: Never  Vaping Use   Vaping status: Never Used  Substance Use Topics   Alcohol use: Yes    Alcohol/week: 4.0 standard drinks of alcohol    Types: 2 Glasses of wine, 2 Cans of beer per week    Comment: 12/19/2013 weekends may drink 3 - 4 drinks, wine or beer or liquor   Drug use: No     Allergies   Losartan, Shellfish allergy, and Sulfa antibiotics   Review of Systems Review of Systems  Constitutional:  Negative for fever.  HENT:  Positive for congestion, rhinorrhea, sneezing and sore throat (initially but better).   Respiratory:  Positive for cough (productive). Negative for shortness of breath.        Chest congestion, rattle in chest    Gastrointestinal:  Positive for diarrhea. Negative for nausea and vomiting.  Musculoskeletal:  Negative for myalgias.  Neurological:  Positive for headaches.  All other systems reviewed and are negative.    Physical Exam Triage Vital Signs ED Triage Vitals  Encounter Vitals Group     BP 04/22/24 0905 131/85     Girls Systolic BP Percentile --      Girls Diastolic BP Percentile --       Boys Systolic BP Percentile --      Boys Diastolic BP Percentile --      Pulse Rate 04/22/24 0905 72     Resp 04/22/24 0905 18     Temp 04/22/24 0905 98.7 F (37.1 C)     Temp Source 04/22/24 0905 Oral     SpO2 04/22/24 0905 98 %     Weight 04/22/24 0902 171 lb 4.8 oz (77.7 kg)     Height 04/22/24 0902 5' 4.5 (1.638 m)     Head Circumference --      Peak Flow --      Pain Score 04/22/24 0900 0     Pain Loc --      Pain Education --      Exclude from Growth Chart --    No data found.  Updated Vital Signs BP 131/85 (BP Location: Left Arm)  Pulse 72   Temp 98.7 F (37.1 C) (Oral)   Resp 18   Ht 5' 4.5 (1.638 m)   Wt 171 lb 4.8 oz (77.7 kg)   LMP  (LMP Unknown)   SpO2 98%   BMI 28.95 kg/m   Visual Acuity Right Eye Distance:   Left Eye Distance:   Bilateral Distance:    Right Eye Near:   Left Eye Near:    Bilateral Near:     Physical Exam   UC Treatments / Results  Labs (all labs ordered are listed, but only abnormal results are displayed) Labs Reviewed  POC SOFIA SARS ANTIGEN FIA    EKG   Radiology No results found.  Procedures Procedures (including critical care time)  Medications Ordered in UC Medications  ipratropium-albuterol  (DUONEB) 0.5-2.5 (3) MG/3ML nebulizer solution 3 mL (3 mLs Nebulization Given 04/22/24 0945)  dexamethasone  (DECADRON ) injection 10 mg (10 mg Intramuscular Given 04/22/24 0948)    Initial Impression / Assessment and Plan / UC Course  I have reviewed the triage vital signs and the nursing notes.  Pertinent labs & imaging results that were available during my care of the patient were reviewed by me and considered in my medical decision making (see chart for details).    The patient presents with symptoms consistent with an upper respiratory infection and bronchitis, which began 6 days ago. Initial symptoms included sneezing and chills, progressing to chest congestion and a productive cough. The patient also reports  some wheezing, described as a rattling sound in the chest, along with a mild sore throat, which was managed with cough drops. Recent onset of loose stools and headaches have been noted. On examination, abnormal lung sounds were auscultated, suggesting lower respiratory involvement. The patient mentioned possible exposure to her mother's boyfriend, who has been coughing. A COVID-19 test was ordered due to the patient's concern, and the result was negative.  In the office, a breathing treatment was administered, providing significant relief of symptoms. The patient was prescribed a prednisone dose pack for inflammation, along with Mucinex  DM, cough medicine, and an inhaler for symptom relief as needed. The patient is advised to monitor symptoms and follow up with their primary care provider if they worsen or persist. If new symptoms such as severe shortness of breath or chest pain develop, the patient should seek immediate care in the emergency department.  Today's evaluation has revealed no signs of a dangerous process. Discussed diagnosis with patient and/or guardian. Patient and/or guardian aware of their diagnosis, possible red flag symptoms to watch out for and need for close follow up. Patient and/or guardian understands verbal and written discharge instructions. Patient and/or guardian comfortable with plan and disposition.  Patient and/or guardian has a clear mental status at this time, good insight into illness (after discussion and teaching) and has clear judgment to make decisions regarding their care  Documentation was completed with the aid of voice recognition software. Transcription may contain typographical errors. Final Clinical Impressions(s) / UC Diagnoses   Final diagnoses:  Acute cough  Acute bronchitis due to Rhinovirus     Discharge Instructions      You have been diagnosed with acute bronchitis, which is a sudden inflammation of the large airways in your lungs. This  inflammation causes the airways to narrow and produce more mucus, making it harder to breathe and leading to coughing. Acute bronchitis is most often caused by the same viruses that cause the common cold.  Take the medications that were prescribed  to you as directed. If you have a fever, headache, or body aches, you can also take Tylenol  or ibuprofen  to help you feel more comfortable. Be sure to drink plenty of fluids to stay hydrated--aim for enough to keep your urine a pale yellow color. This will also help to thin mucus and make it easier to clear from your body. The prescribed cough medicine should help loosen mucus, relieve congestion, and ease your cough. Taking two teaspoons of honey at bedtime may also help reduce nighttime coughing. Avoid using any nicotine or tobacco products, as they can worsen your symptoms and delay healing.   Using a cool mist humidifier at home to keep humidity levels above 50% can be helpful. You can also inhale steam for 10 to 15 minutes, 3 to 4 times a day. This can be done by sitting in the bathroom with a hot shower running, or by using over-the-counter vapor shower tablets to help with nasal congestion. Try to avoid cool or dry air as much as possible. Be sure to get enough rest every night to support your recovery. Don't forget to replace your toothbrush once you start feeling better.   It's normal for a cough to linger for several weeks after a respiratory illness, even after other symptoms have resolved. This happens because the airways remain irritated and take time to fully heal. As long as the cough gradually improves and there are no new concerning symptoms, this is part of the normal recovery process.  Seek emergency care right away if you cough up blood, feel chest pain, have severe shortness of breath, faint or feel like you might faint, develop a severe headache, or experience worsening fever or chills.      ED Prescriptions     Medication Sig Dispense  Auth. Provider   amoxicillin  (AMOXIL ) 875 MG tablet Take 1 tablet (875 mg total) by mouth 2 (two) times daily at 8 am and 6 pm for 7 days. 14 tablet Makana Rostad, [place], FNP   methylPREDNISolone  (MEDROL  DOSEPAK) 4 MG TBPK tablet Take as directed 21 tablet Zahirah Cheslock, FNP   Dextromethorphan-guaiFENesin  (MUCINEX  DM MAXIMUM STRENGTH) 60-1200 MG TB12 Take 1 tablet by mouth 2 (two) times daily. 20 tablet Iola Lukes, FNP   promethazine -dextromethorphan (PROMETHAZINE -DM) 6.25-15 MG/5ML syrup Take 10 mLs by mouth every 6 (six) hours as needed for cough. 118 mL Iola Lukes, FNP   albuterol  (VENTOLIN  HFA) 108 (90 Base) MCG/ACT inhaler Inhale 2 puffs into the lungs every 4 (four) hours as needed for wheezing or shortness of breath (bronchospasms). 18 g Iola Lukes, FNP      PDMP not reviewed this encounter.   Iola Lukes, [place] 04/22/24 [phone]

## 2024-04-22 NOTE — Discharge Instructions (Addendum)
 You have been diagnosed with acute bronchitis, which is a sudden inflammation of the large airways in your lungs. This inflammation causes the airways to narrow and produce more mucus, making it harder to breathe and leading to coughing. Acute bronchitis is most often caused by the same viruses that cause the common cold.  Take the medications that were prescribed to you as directed. If you have a fever, headache, or body aches, you can also take Tylenol or ibuprofen to help you feel more comfortable. Be sure to drink plenty of fluids to stay hydrated--aim for enough to keep your urine a pale yellow color. This will also help to thin mucus and make it easier to clear from your body. The prescribed cough medicine should help loosen mucus, relieve congestion, and ease your cough. Taking two teaspoons of honey at bedtime may also help reduce nighttime coughing. Avoid using any nicotine or tobacco products, as they can worsen your symptoms and delay healing.   Using a cool mist humidifier at home to keep humidity levels above 50% can be helpful. You can also inhale steam for 10 to 15 minutes, 3 to 4 times a day. This can be done by sitting in the bathroom with a hot shower running, or by using over-the-counter vapor shower tablets to help with nasal congestion. Try to avoid cool or dry air as much as possible. Be sure to get enough rest every night to support your recovery. Don't forget to replace your toothbrush once you start feeling better.   It's normal for a cough to linger for several weeks after a respiratory illness, even after other symptoms have resolved. This happens because the airways remain irritated and take time to fully heal. As long as the cough gradually improves and there are no new concerning symptoms, this is part of the normal recovery process.  Seek emergency care right away if you cough up blood, feel chest pain, have severe shortness of breath, faint or feel like you might faint,  develop a severe headache, or experience worsening fever or chills.

## 2024-04-22 NOTE — ED Triage Notes (Signed)
 This started with sneezing about 6 days ago with cold chills, this kept getting worse instead of better, now with chest congestion/cough (productive), with ? Blood in the sputum/mucous. I need to make sure I don't have COVID19. No fever known.

## 2024-06-06 ENCOUNTER — Ambulatory Visit: Admitting: Family Medicine

## 2024-08-04 ENCOUNTER — Ambulatory Visit: Admitting: Family Medicine

## 2024-08-11 ENCOUNTER — Ambulatory Visit: Admitting: Family Medicine

## 2024-08-11 NOTE — Progress Notes (Deleted)
 "   Patient ID: Kristen Rosario, female  DOB: 1970-11-18, 53 y.o.   MRN: [id number] Patient Care Team    Relationship Specialty Notifications Start End  Catherine Charlies LABOR, DO PCP - General Family Medicine  03/15/20     No chief complaint on file.   Subjective:  Kristen Rosario is a 53 y.o.  Female  present for Chronic Conditions/illness Management All past medical history, surgical history, allergies, family history, immunizations, medications and social history were updated in the electronic medical record today. All recent labs, ED visits and hospitalizations within the last year were reviewed.  Hypertension:  Pt reports compliance with hctz 25 mg and amlodipine  2.5 mg QD . She has a h/o HTN and had been on HCTZ and losartan at one time. She felt she was having a rash to losartan and had stopped.  Patient denies chest pain, shortness of breath, dizziness or lower extremity edema.    She has changed her diet and trying to eat lower sodium and exercise. [place]     12/20/2023    9:32 AM 03/01/2023    9:13 AM 05/25/2022    9:02 AM 01/12/2022    9:53 AM 06/07/2020    1:35 PM  Depression screen PHQ 2/9  Decreased Interest 0 0 0 0 0  Down, Depressed, Hopeless 0 0 0 0 0  PHQ - 2 Score 0 0 0 0 0  Altered sleeping 0 3     Tired, decreased energy 0 0     Change in appetite 0 0     Feeling bad or failure about yourself  0 0     Trouble concentrating 0 0     Moving slowly or fidgety/restless 0 0     Suicidal thoughts 0 0     PHQ-9 Score 0  3      Difficult doing work/chores Not difficult at all Not difficult at all        Data saved with a previous flowsheet row definition      12/20/2023    9:32 AM 03/01/2023    9:13 AM  GAD 7 : Generalized Anxiety Score  Nervous, Anxious, on Edge 1 0  Control/stop worrying 0 1  Worry too much - different things 1 3  Trouble relaxing 0 1  Restless 0 1  Easily annoyed or irritable 1 2  Afraid - awful might  happen 0 0  Total GAD 7 Score 3 8  Anxiety Difficulty Not difficult at all Somewhat difficult     Immunization History  Administered Date(s) Administered   Moderna Sars-Covid-2 Vaccination 05/24/2020, 06/21/2020   Tdap 05/25/2022     Past Medical History:  Diagnosis Date   Headache(784.0)    maybe monthly (12/19/2013)   Hypertension    dx'd; never took RX (12/19/2013)   Urticaria    Vitreous hemorrhage of left eye (HCC)    Allergies  Allergen Reactions   Losartan Anaphylaxis    rash   Shellfish Allergy Anaphylaxis   Sulfa Antibiotics Hives   Past Surgical History:  Procedure Laterality Date   MEMBRANE PEEL Left 12/19/2013   Procedure: MEMBRANE PEEL;  Surgeon: Norleen JONETTA Ku, MD;  Location: [facility] OR;  Service: Ophthalmology;  Laterality: Left;   PARS PLANA VITRECTOMY Left 12/19/2013   PARS PLANA VITRECTOMY Left 12/19/2013   Procedure: PARS PLANA VITRECTOMY WITH 25 GAUGE; ENDOLASER;FLUID/AIR EXCHANGE;  Surgeon: Norleen JONETTA Ku, MD;  Location: [facility] OR;  Service:  Ophthalmology;  Laterality: Left;   WISDOM TOOTH EXTRACTION  ~ 2001   Family History  Problem Relation Age of Onset   Allergic rhinitis Mother    Hypertension Mother    Allergic rhinitis Father    Asthma Sister    Hypertension Sister    Asthma Brother    Hypertension Brother    Social History   Social History Narrative   Marital status/children/pets: single   Education/employment: some college. [employer]:      -smoke alarm in the home:Yes     - wears seatbelt: Yes     - Feels safe in their relationships: Yes    Allergies as of 08/11/2024       Reactions   Losartan Anaphylaxis   rash   Shellfish Allergy Anaphylaxis   Sulfa Antibiotics Hives        Medication List        Accurate as of August 11, 2024  7:35 AM. If you have any questions, ask your nurse or doctor.          STOP taking these medications    hydrALAZINE  25 MG tablet Commonly known as: APRESOLINE     methylPREDNISolone  4 MG Tbpk tablet Commonly known as: MEDROL  DOSEPAK   Mucinex  DM Maximum Strength 60-1200 MG Tb12   promethazine -dextromethorphan 6.25-15 MG/5ML syrup Commonly known as: PROMETHAZINE -DM       TAKE these medications    albuterol  108 (90 Base) MCG/ACT inhaler Commonly known as: VENTOLIN  HFA Inhale 2 puffs into the lungs every 4 (four) hours as needed for wheezing or shortness of breath (bronchospasms).   amLODipine  2.5 MG tablet Commonly known as: NORVASC  TAKE 1 TABLET BY MOUTH EVERY DAY   famotidine  20 MG tablet Commonly known as: PEPCID  Take 1 tablet (20 mg total) by mouth 2 (two) times daily.   hydrochlorothiazide  25 MG tablet Commonly known as: HYDRODIURIL  Take 1 tablet (25 mg total) by mouth daily.   loratadine 10 MG tablet Commonly known as: CLARITIN Take 10 mg by mouth daily.   potassium chloride  SA 20 MEQ tablet Commonly known as: KLOR-CON  M 1 tab twice daily for 3 days, then 1 tab daily.   sodium chloride  0.65 % Soln nasal spray Commonly known as: OCEAN Place 1 spray into both nostrils as needed for congestion.   Xolair 150 MG/ML prefilled syringe Generic drug: omalizumab Inject into the skin.        All past medical history, surgical history, allergies, family history, immunizations andmedications were updated in the EMR today and reviewed under the history and medication portions of their EMR.       Review of Systems  Constitutional: Negative.   HENT: Negative.    Eyes: Negative.   Respiratory: Negative.    Cardiovascular: Negative.   Gastrointestinal: Negative.   Genitourinary: Negative.   Musculoskeletal: Negative.   Skin: Negative.   Neurological: Negative.   Endo/Heme/Allergies: Negative.   Psychiatric/Behavioral: Negative.    All other systems reviewed and are negative.  14 pt review of systems performed and negative (unless mentioned in an HPI)  Objective: There were no vitals taken for this visit. Physical  Exam Vitals and nursing note reviewed.  Constitutional:      General: She is not in acute distress.    Appearance: Normal appearance. She is not ill-appearing, toxic-appearing or diaphoretic.  HENT:     Head: Normocephalic and atraumatic.  Eyes:     General: No scleral icterus.       Right  eye: No discharge.        Left eye: No discharge.     Extraocular Movements: Extraocular movements intact.     Conjunctiva/sclera: Conjunctivae normal.     Pupils: Pupils are equal, round, and reactive to light.  Cardiovascular:     Rate and Rhythm: Normal rate and regular rhythm.     Heart sounds: No murmur heard. Pulmonary:     Effort: Pulmonary effort is normal. No respiratory distress.     Breath sounds: Normal breath sounds. No wheezing, rhonchi or rales.  Musculoskeletal:     Right lower leg: No edema.     Left lower leg: No edema.  Skin:    General: Skin is warm.     Findings: No rash.  Neurological:     Mental Status: She is alert and oriented to person, place, and time. Mental status is at baseline.     Motor: No weakness.     Gait: Gait normal.  Psychiatric:        Mood and Affect: Mood normal.        Behavior: Behavior normal.        Thought Content: Thought content normal.        Judgment: Judgment normal.      No results found.  Assessment/plan: Kristen Rosario is a 53 y.o. female present for Chronic Conditions/illness Management Essential hypertension/overweight ***Amlodipine  2.5 mg ***Hydrochlorothiazide  25 mg   ***Kdur 20 MEQ Diet and exercise recommended.   Health maintenance: Breast cancer screening*** Colon cancer screening*** Influenza vaccine***  No follow-ups on file.  Needs CPE and Pap   No orders of the defined types were placed in this encounter.  No orders of the defined types were placed in this encounter.  Referral Orders  No referral(s) requested today     Electronically signed by: Charlies Bellini, DO [facility] "

## 2024-08-15 ENCOUNTER — Encounter: Payer: Self-pay | Admitting: Family Medicine

## 2024-08-21 ENCOUNTER — Encounter: Payer: Self-pay | Admitting: Family Medicine

## 2024-08-21 ENCOUNTER — Ambulatory Visit: Admitting: Family Medicine

## 2024-08-21 VITALS — BP 124/78 | HR 65 | Temp 98.1°F | Wt 172.6 lb

## 2024-08-21 DIAGNOSIS — M25561 Pain in right knee: Secondary | ICD-10-CM | POA: Diagnosis not present

## 2024-08-21 DIAGNOSIS — M25461 Effusion, right knee: Secondary | ICD-10-CM | POA: Diagnosis not present

## 2024-08-21 MED ORDER — HYDROCHLOROTHIAZIDE 25 MG PO TABS: 25.0000 mg | ORAL_TABLET | Freq: Every day | ORAL | 0 refills | Status: AC

## 2024-08-21 MED ORDER — DICLOFENAC SODIUM 75 MG PO TBEC: 75.0000 mg | DELAYED_RELEASE_TABLET | Freq: Two times a day (BID) | ORAL | 1 refills | Status: AC

## 2024-08-21 MED ORDER — POTASSIUM CHLORIDE CRYS ER 20 MEQ PO TBCR: EXTENDED_RELEASE_TABLET | ORAL | 0 refills | Status: AC

## 2024-08-21 MED ORDER — AMLODIPINE BESYLATE 2.5 MG PO TABS: 2.5000 mg | ORAL_TABLET | Freq: Every day | ORAL | 0 refills | Status: AC

## 2024-08-21 NOTE — Progress Notes (Signed)
 "   Patient ID: Kristen Rosario, female  DOB: 07-12-1971, 53 y.o.   MRN: [id number] Patient Care Team    Relationship Specialty Notifications Start End  Catherine Charlies LABOR, DO PCP - General Family Medicine  03/15/20     Chief Complaint  Patient presents with   Joint Swelling    1 month; R knee.    Subjective:  Kristen Rosario is a 53 y.o.  Female  present for new acute concern of right knee pain and swelling of 1 month duration-and request refills of her chronic condition medications All past medical history, surgical history, allergies, family history, immunizations, medications and social history were updated in the electronic medical record today. All recent labs, ED visits and hospitalizations within the last year were reviewed.  Hypertension:  Pt reports compliance with hctz 25 mg and amlodipine  2.5 mg QD . She has a h/o HTN and had been on HCTZ and losartan at one time. She felt she was having a rash to losartan and had stopped.  Patient denies chest pain, shortness of breath, dizziness or lower extremity edema.     Patient complains of right knee swelling of 1 month duration.  She denies any known enticing event or increase in activity that could potentially have caused most recent swelling.  She denies past history of injury.  She states she used to play football when she was younger. She reports on occasion she will get some mild swelling and discomfort along the medial joint line of her right knee that usually resolves with NSAID therapy.  However, over the last 4 weeks knee has remained swollen and uncomfortable.  She has been wearing a neoprene sleeve and taking 600 mg of Advil  on occasion.  She has been icing her knee and using medicated ointments. Pain seems to be worse when she is on her feet for long periods of time and when walking down steps.     08/21/2024    8:28 AM 12/20/2023    9:32 AM 03/01/2023    9:13 AM 05/25/2022    9:02 AM 01/12/2022    9:53 AM  Depression screen  PHQ 2/9  Decreased Interest 0 0 0 0 0  Down, Depressed, Hopeless 0 0 0 0 0  PHQ - 2 Score 0 0 0 0 0  Altered sleeping  0 3    Tired, decreased energy  0 0    Change in appetite  0 0    Feeling bad or failure about yourself   0 0    Trouble concentrating  0 0    Moving slowly or fidgety/restless  0 0    Suicidal thoughts  0 0    PHQ-9 Score  0  3     Difficult doing work/chores  Not difficult at all Not difficult at all       Data saved with a previous flowsheet row definition      12/20/2023    9:32 AM 03/01/2023    9:13 AM  GAD 7 : Generalized Anxiety Score  Nervous, Anxious, on Edge 1 0  Control/stop worrying 0 1  Worry too much - different things 1 3  Trouble relaxing 0 1  Restless 0 1  Easily annoyed or irritable 1 2  Afraid - awful might happen 0 0  Total GAD 7 Score 3 8  Anxiety Difficulty Not difficult at all Somewhat difficult        02/12/2022   11:20 AM 05/25/2022  9:02 AM 03/01/2023    9:13 AM 12/20/2023    9:32 AM 08/21/2024    8:28 AM  Fall Risk  Falls in the past year?  0 0 0 0  Was there an injury with Fall?  0    0  Fall Risk Category Calculator  0   0  Fall Risk Category (Retired)  Low      (RETIRED) Patient Fall Risk Level Low fall risk  Low fall risk      Patient at Risk for Falls Due to  No Fall Risks No Fall Risks  No Fall Risks  Fall risk Follow up  Falls evaluation completed  Falls evaluation completed Falls evaluation completed Falls evaluation completed     Data saved with a previous flowsheet row definition    Immunization History  Administered Date(s) Administered   Moderna Sars-Covid-2 Vaccination 05/24/2020, 06/21/2020   Tdap 05/25/2022     Past Medical History:  Diagnosis Date   Headache(784.0)    maybe monthly (12/19/2013)   Hypertension    dx'd; never took RX (12/19/2013)   Urticaria    Vitreous hemorrhage of left eye (HCC)    Allergies  Allergen Reactions   Losartan Anaphylaxis    rash   Shellfish Allergy Anaphylaxis    Sulfa Antibiotics Hives   Past Surgical History:  Procedure Laterality Date   MEMBRANE PEEL Left 12/19/2013   Procedure: MEMBRANE PEEL;  Surgeon: Norleen JONETTA Ku, MD;  Location: [facility] OR;  Service: Ophthalmology;  Laterality: Left;   PARS PLANA VITRECTOMY Left 12/19/2013   PARS PLANA VITRECTOMY Left 12/19/2013   Procedure: PARS PLANA VITRECTOMY WITH 25 GAUGE; ENDOLASER;FLUID/AIR EXCHANGE;  Surgeon: Norleen JONETTA Ku, MD;  Location: [facility] OR;  Service: Ophthalmology;  Laterality: Left;   WISDOM TOOTH EXTRACTION  ~ 2001   Family History  Problem Relation Age of Onset   Allergic rhinitis Mother    Hypertension Mother    Allergic rhinitis Father    Asthma Sister    Hypertension Sister    Asthma Brother    Hypertension Brother    Social History   Social History Narrative   Marital status/children/pets: single   Education/employment: some college. [employer]:      -smoke alarm in the home:Yes     - wears seatbelt: Yes     - Feels safe in their relationships: Yes    Allergies as of 08/21/2024       Reactions   Losartan Anaphylaxis   rash   Shellfish Allergy Anaphylaxis   Sulfa Antibiotics Hives        Medication List        Accurate as of August 21, 2024  9:59 AM. If you have any questions, ask your nurse or doctor.          albuterol  108 (90 Base) MCG/ACT inhaler Commonly known as: VENTOLIN  HFA Inhale 2 puffs into the lungs every 4 (four) hours as needed for wheezing or shortness of breath (bronchospasms).   amLODipine  2.5 MG tablet Commonly known as: NORVASC  Take 1 tablet (2.5 mg total) by mouth daily.   diclofenac  75 MG EC tablet Commonly known as: VOLTAREN  Take 1 tablet (75 mg total) by mouth every 12 (twelve) hours. Started by: Charlies Bellini, DO   famotidine  20 MG tablet Commonly known as: PEPCID  Take 1 tablet (20 mg total) by mouth 2 (two) times daily.   hydrochlorothiazide  25 MG tablet Commonly known as: HYDRODIURIL  Take 1 tablet (25 mg  total) by  mouth daily.   loratadine 10 MG tablet Commonly known as: CLARITIN Take 10 mg by mouth daily.   potassium chloride  SA 20 MEQ tablet Commonly known as: KLOR-CON  M 1 tab twice daily for 3 days, then 1 tab daily.   sodium chloride  0.65 % Soln nasal spray Commonly known as: OCEAN Place 1 spray into both nostrils as needed for congestion.   Xolair 150 MG/ML prefilled syringe Generic drug: omalizumab Inject into the skin.        All past medical history, surgical history, allergies, family history, immunizations andmedications were updated in the EMR today and reviewed under the history and medication portions of their EMR.       Review of Systems  Constitutional: Negative.   HENT: Negative.    Eyes: Negative.   Respiratory: Negative.    Cardiovascular: Negative.   Gastrointestinal: Negative.   Genitourinary: Negative.   Musculoskeletal:  Positive for joint pain.  Skin: Negative.   Neurological: Negative.   Endo/Heme/Allergies: Negative.   Psychiatric/Behavioral: Negative.    All other systems reviewed and are negative.  14 pt review of systems performed and negative (unless mentioned in an HPI)  Objective: BP 124/78   Pulse 65   Temp 98.1 F (36.7 C)   Wt 172 lb 9.6 oz (78.3 kg)   SpO2 97%   BMI 29.17 kg/m  Physical Exam Vitals and nursing note reviewed.  Constitutional:      General: She is not in acute distress.    Appearance: Normal appearance. She is normal weight. She is not ill-appearing, toxic-appearing or diaphoretic.  HENT:     Head: Normocephalic and atraumatic.  Eyes:     General: No scleral icterus.       Right eye: No discharge.        Left eye: No discharge.     Extraocular Movements: Extraocular movements intact.     Conjunctiva/sclera: Conjunctivae normal.     Pupils: Pupils are equal, round, and reactive to light.  Cardiovascular:     Rate and Rhythm: Normal rate and regular rhythm.     Heart sounds: No murmur  heard. Pulmonary:     Effort: Pulmonary effort is normal. No respiratory distress.     Breath sounds: Normal breath sounds. No wheezing, rhonchi or rales.  Musculoskeletal:     Right knee: Swelling, effusion, bony tenderness and crepitus present. No erythema. Decreased range of motion. Tenderness present over the medial joint line. No LCL laxity, MCL laxity, ACL laxity or PCL laxity. Normal pulse.     Instability Tests: Anterior drawer test negative. Medial McMurray test positive.     Left knee: Normal.     Right lower leg: No edema.     Left lower leg: No edema.  Skin:    General: Skin is warm.     Findings: No rash.  Neurological:     Mental Status: She is alert and oriented to person, place, and time. Mental status is at baseline.     Motor: No weakness.     Coordination: Coordination normal.     Gait: Gait normal.  Psychiatric:        Mood and Affect: Mood normal.        Behavior: Behavior normal.        Thought Content: Thought content normal.        Judgment: Judgment normal.      No results found.  Assessment/plan: Kristen Rosario is a 53 y.o. female present for new acute concern  of right knee pain and swelling and request refills on her chronic condition medications. Essential hypertension/overweight Stable Continue amlodipine  2.5 mg Continue hydrochlorothiazide  25 mg   Continue Kdur 20 MEQ Diet and exercise recommended.   Health maintenance: Breast cancer screening-declined Colon cancer screening- declined Influenza vaccine-declined   Right knee pain and swelling: Patient is tender over medial joint line, suspecting meniscal etiology of swelling and discomfort. We discussed treatment options today and settled on starting diclofenac  twice daily with food for 4 weeks. Right knee x-rays ordered Once results received will call patient, and discuss plan and follow-up.   Will refer to orthopedics versus sports med, if appropriate after x-rays results  received.  Return in about 11 weeks (around 11/06/2024) for cpe (40 min), Routine chronic condition follow-up w/ pap.     Orders Placed This Encounter  Procedures   DG Knee Complete 4 Views Right   Meds ordered this encounter  Medications   diclofenac  (VOLTAREN ) 75 MG EC tablet    Sig: Take 1 tablet (75 mg total) by mouth every 12 (twelve) hours.    Dispense:  60 tablet    Refill:  1   potassium chloride  SA (KLOR-CON  M) 20 MEQ tablet    Sig: 1 tab twice daily for 3 days, then 1 tab daily.    Dispense:  90 tablet    Refill:  0   hydrochlorothiazide  (HYDRODIURIL ) 25 MG tablet    Sig: Take 1 tablet (25 mg total) by mouth daily.    Dispense:  90 tablet    Refill:  0   amLODipine  (NORVASC ) 2.5 MG tablet    Sig: Take 1 tablet (2.5 mg total) by mouth daily.    Dispense:  90 tablet    Refill:  0   Referral Orders  No referral(s) requested today     Electronically signed by: Charlies Bellini, DO [facility] "

## 2024-08-21 NOTE — Patient Instructions (Addendum)
 Return in about 4 weeks (around 09/18/2024) for cpe (40 min), Routine chronic condition follow-up w/ pap.  We will call you with xray results Please report to Med center at the [facility] location to have xray completed      Great to see you today.  I have refilled the medication(s) we provide.   If labs were collected or images ordered, we will inform you of  results once we have received them and reviewed. We will contact you either by echart message, or telephone call.  Please give ample time to the testing facility, and our office to run,  receive and review results. Please do not call inquiring of results, even if you can see them in your chart. We will contact you as soon as we are able. If it has been over 1 week since the test was completed, and you have not yet heard from us , then please call us .    - echart message- for normal results that have been seen by the patient already.   - telephone call: abnormal results or if patient has not viewed results in their echart.  If a referral to a specialist was entered for you, please call us  in 2 weeks if you have not heard from the specialist office to schedule.
